# Patient Record
Sex: Female | Born: 1985 | Race: White | Hispanic: No | Marital: Married | Smoking: Never smoker
Health system: Southern US, Community
[De-identification: ages and names within clinical notes are randomized; demographics above are authoritative.]

## PROBLEM LIST (undated history)

## (undated) DIAGNOSIS — E079 Disorder of thyroid, unspecified: Secondary | ICD-10-CM

## (undated) HISTORY — PX: TUBAL LIGATION: SHX77

---
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
## 2005-09-22 ENCOUNTER — Emergency Department (HOSPITAL_COMMUNITY): Admission: EM | Admit: 2005-09-22 | Discharge: 2005-09-22 | Payer: Self-pay | Admitting: Emergency Medicine

## 2005-11-05 ENCOUNTER — Observation Stay (HOSPITAL_COMMUNITY): Admission: RE | Admit: 2005-11-05 | Discharge: 2005-11-05 | Payer: Self-pay | Admitting: Specialist

## 2015-07-11 ENCOUNTER — Emergency Department (HOSPITAL_COMMUNITY)
Admission: EM | Admit: 2015-07-11 | Discharge: 2015-07-11 | Disposition: A | Discharge status: Home / Self Care | Payer: BLUE CROSS/BLUE SHIELD | Attending: Emergency Medicine | Admitting: Emergency Medicine

## 2015-07-11 ENCOUNTER — Encounter (HOSPITAL_COMMUNITY): Payer: Self-pay | Admitting: Emergency Medicine

## 2015-07-11 DIAGNOSIS — Y9389 Activity, other specified: Secondary | ICD-10-CM | POA: Diagnosis not present

## 2015-07-11 DIAGNOSIS — Y999 Unspecified external cause status: Secondary | ICD-10-CM | POA: Diagnosis not present

## 2015-07-11 DIAGNOSIS — Y929 Unspecified place or not applicable: Secondary | ICD-10-CM | POA: Insufficient documentation

## 2015-07-11 DIAGNOSIS — W272XXA Contact with scissors, initial encounter: Secondary | ICD-10-CM | POA: Insufficient documentation

## 2015-07-11 DIAGNOSIS — S61213A Laceration without foreign body of left middle finger without damage to nail, initial encounter: Secondary | ICD-10-CM | POA: Insufficient documentation

## 2015-07-11 DIAGNOSIS — IMO0002 Reserved for concepts with insufficient information to code with codable children: Secondary | ICD-10-CM

## 2015-07-11 MED ORDER — LIDOCAINE HCL (PF) 2 % IJ SOLN
INTRAMUSCULAR | Status: AC
  Filled 2015-07-11: qty 10

## 2015-07-11 NOTE — ED Notes (Addendum)
Pt with lac to middle of left middle finger today from shears, bleeding controlled at this time.  last tetanus shot 5-10 years per pt

## 2015-07-11 NOTE — ED Notes (Signed)
Patient c/o laceration to tight middle finger. 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 No active bleeding noted at this. Band-aid applied.

## 2015-07-11 NOTE — ED Provider Notes (Signed)
CSN: [id number]     Arrival date & time 07/11/15  1048 History   First MD Initiated Contact with Patient 07/11/15 1056     Chief Complaint  Patient presents with  . Laceration     (Consider location/radiation/quality/duration/timing/severity/associated sxs/prior Treatment) The history is provided by the patient.   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  She has obtained hemostasis by applying direct pressure and a bandage.  She denies numbness distal to the injury site.  She has no significant past medical history.  She is up-to-date with her tetanus vaccine.     History reviewed. No pertinent past medical history. Past Surgical History  Procedure Laterality Date  . Tubal ligation     History reviewed. No pertinent family history. Social History  Substance Use Topics  . Smoking status: Never Smoker   . Smokeless tobacco: Never Used  . Alcohol Use: No   OB History    Gravida Para Term Preterm AB TAB SAB Ectopic Multiple Living   Review of Systems  Constitutional: Negative for fever and chills.  Respiratory: Negative for shortness of breath and wheezing.   Skin: Positive for wound.  Neurological: Negative for numbness.      Allergies  Review of patient's allergies indicates no known allergies.  Home Medications   Prior to Admission medications   Not on File   BP 124/73 mmHg  Pulse 86  Temp(Src) 98.3 F (36.8 C) (Oral)  Resp 18  Ht  (1.702 m)  Wt 52.164 kg  BMI 18.01 kg/m2  SpO2 10%  LMP 07/04/2015   Patient in no respiratory distress.  SPO2 of 10% is an obvious typo.  Nursing staff asked to repeat. Physical Exam  Constitutional: She is oriented to person, place, and time. She appears well-developed and well-nourished.  HENT:  Head: Normocephalic.  Cardiovascular: Normal rate.    Pulmonary/Chest: Effort normal.  Musculoskeletal: She exhibits no tenderness.  Neurological: She is alert and oriented to person, place, and time. No sensory deficit.  Skin: Laceration noted.  1 cm superficial laceration left dorsal long finger over PIP joint.  No deep structures visualized.  There is a flap laceration component to this wound.  Distal sensation is intact with less than 2 second cap refill.    ED Course  Procedures (including critical care time)  LACERATION REPAIR Performed by: Burgess Amor Authorized by: Burgess Amor Consent: Verbal consent obtained. Risks and benefits: risks, benefits and alternatives were discussed Consent given by: patient Patient identity confirmed: provided demographic data Prepped and Draped in normal sterile fashion Wound explored  Laceration Location: left long finger  Laceration Length: 1cm  No Foreign Bodies seen or palpated  Anesthesia: Digital block   Local anesthetic: lidocaine 2% without epinephrine  Anesthetic total: 2 ml  Irrigation method: syringe Amount of cleaning: standard  Skin closure: Ethilon 4-0   Number of sutures: 2   Technique: Simple interrupted   Patient tolerance: Patient tolerated the procedure well with no immediate complications.  Labs Review Labs Reviewed - No data to display  Imaging Review No results found. I have personally reviewed and evaluated these images and lab results as part of my medical decision-making.   EKG Interpretation None      MDM   Final diagnoses:  Laceration  Patients wound was dressed then finger splint applied to avoid flexion of the wound for the next several days.  Wound care instructions given.  Pt advised to have sutures removed in 10 days,  Return here sooner for any signs of infection including redness, swelling, worse pain or drainage of pus.       Burgess AmorJulie Ruther Ephraim, PA-C 07/11/15 1646  Glynn OctaveStephen Rancour, MD 07/11/15 [phone]

## 2016-11-11 ENCOUNTER — Encounter (HOSPITAL_COMMUNITY): Payer: Self-pay | Admitting: Emergency Medicine

## 2016-11-11 ENCOUNTER — Emergency Department (HOSPITAL_COMMUNITY): Payer: BLUE CROSS/BLUE SHIELD

## 2016-11-11 ENCOUNTER — Emergency Department (HOSPITAL_COMMUNITY)
Admission: EM | Admit: 2016-11-11 | Discharge: 2016-11-11 | Disposition: A | Discharge status: Home / Self Care | Payer: BLUE CROSS/BLUE SHIELD | Attending: Emergency Medicine | Admitting: Emergency Medicine

## 2016-11-11 DIAGNOSIS — N3090 Cystitis, unspecified without hematuria: Secondary | ICD-10-CM | POA: Diagnosis not present

## 2016-11-11 DIAGNOSIS — R946 Abnormal results of thyroid function studies: Secondary | ICD-10-CM | POA: Insufficient documentation

## 2016-11-11 DIAGNOSIS — R000 Tachycardia, unspecified: Secondary | ICD-10-CM | POA: Insufficient documentation

## 2016-11-11 DIAGNOSIS — R002 Palpitations: Secondary | ICD-10-CM | POA: Diagnosis not present

## 2016-11-11 DIAGNOSIS — E0590 Thyrotoxicosis, unspecified without thyrotoxic crisis or storm: Secondary | ICD-10-CM | POA: Insufficient documentation

## 2016-11-11 DIAGNOSIS — R0602 Shortness of breath: Secondary | ICD-10-CM | POA: Diagnosis not present

## 2016-11-11 DIAGNOSIS — R7989 Other specified abnormal findings of blood chemistry: Secondary | ICD-10-CM

## 2016-11-11 DIAGNOSIS — Z79899 Other long term (current) drug therapy: Secondary | ICD-10-CM | POA: Diagnosis not present

## 2016-11-11 LAB — URINALYSIS, ROUTINE W REFLEX MICROSCOPIC
BILIRUBIN URINE: NEGATIVE
Glucose, UA: NEGATIVE mg/dL
Hgb urine dipstick: NEGATIVE
Ketones, ur: 20 mg/dL — AB
Nitrite: NEGATIVE
PH: 6 (ref 5.0–8.0)
Protein, ur: NEGATIVE mg/dL
SPECIFIC GRAVITY, URINE: 1.016 (ref 1.005–1.030)

## 2016-11-11 LAB — COMPREHENSIVE METABOLIC PANEL
ALK PHOS: 39 U/L (ref 38–126)
ALT: 21 U/L (ref 14–54)
ANION GAP: 9 (ref 5–15)
AST: 23 U/L (ref 15–41)
Albumin: 4.1 g/dL (ref 3.5–5.0)
BUN: 17 mg/dL (ref 6–20)
CALCIUM: 9.6 mg/dL (ref 8.9–10.3)
CO2: 24 mmol/L (ref 22–32)
CREATININE: 0.58 mg/dL (ref 0.44–1.00)
Chloride: 104 mmol/L (ref 101–111)
Glucose, Bld: 97 mg/dL (ref 65–99)
Potassium: 3.6 mmol/L (ref 3.5–5.1)
SODIUM: 137 mmol/L (ref 135–145)
TOTAL PROTEIN: 7.3 g/dL (ref 6.5–8.1)
Total Bilirubin: 1.2 mg/dL (ref 0.3–1.2)

## 2016-11-11 LAB — CBC WITH DIFFERENTIAL/PLATELET
BASOS PCT: 0 %
Basophils Absolute: 0 10*3/uL (ref 0.0–0.1)
EOS ABS: 0 10*3/uL (ref 0.0–0.7)
Eosinophils Relative: 1 %
HEMATOCRIT: 36.7 % (ref 36.0–46.0)
HEMOGLOBIN: 12.6 g/dL (ref 12.0–15.0)
LYMPHS ABS: 1 10*3/uL (ref 0.7–4.0)
Lymphocytes Relative: 18 %
MCH: 30.3 pg (ref 26.0–34.0)
MCHC: 34.3 g/dL (ref 30.0–36.0)
MCV: 88.2 fL (ref 78.0–100.0)
MONO ABS: 0.6 10*3/uL (ref 0.1–1.0)
MONOS PCT: 11 %
NEUTROS ABS: 4.1 10*3/uL (ref 1.7–7.7)
NEUTROS PCT: 70 %
Platelets: 153 10*3/uL (ref 150–400)
RBC: 4.16 MIL/uL (ref 3.87–5.11)
RDW: 11.7 % (ref 11.5–15.5)
WBC: 5.8 10*3/uL (ref 4.0–10.5)

## 2016-11-11 LAB — RAPID URINE DRUG SCREEN, HOSP PERFORMED
AMPHETAMINES: NOT DETECTED
BENZODIAZEPINES: NOT DETECTED
Barbiturates: NOT DETECTED
COCAINE: NOT DETECTED
OPIATES: NOT DETECTED
TETRAHYDROCANNABINOL: NOT DETECTED

## 2016-11-11 LAB — PREGNANCY, URINE: PREG TEST UR: NEGATIVE

## 2016-11-11 LAB — T4, FREE: Free T4: 4.43 ng/dL — ABNORMAL HIGH (ref 0.61–1.12)

## 2016-11-11 LAB — TSH

## 2016-11-11 LAB — D-DIMER, QUANTITATIVE (NOT AT ARMC): D DIMER QUANT: 0.54 ug{FEU}/mL — AB (ref 0.00–0.50)

## 2016-11-11 LAB — TROPONIN I: Troponin I: <0.03 ng/mL (ref <0.03)

## 2016-11-11 MED ORDER — CEPHALEXIN 500 MG PO CAPS: 500.0000 mg | ORAL_CAPSULE | Freq: Four times a day (QID) | ORAL | 0 refills | Status: DC

## 2016-11-11 MED ORDER — ATENOLOL 25 MG PO TABS: 12.5000 mg | ORAL_TABLET | Freq: Every day | ORAL | 0 refills | Status: DC

## 2016-11-11 MED ORDER — IOPAMIDOL (ISOVUE-370) INJECTION 76%
100.0000 mL | Freq: Once | INTRAVENOUS | Status: AC | PRN
  Administered 2016-11-11: 100 mL via INTRAVENOUS

## 2016-11-11 MED ORDER — SODIUM CHLORIDE 0.9 % IV BOLUS (SEPSIS)
1000.0000 mL | Freq: Once | INTRAVENOUS | Status: AC
  Administered 2016-11-11: 1000 mL via INTRAVENOUS

## 2016-11-11 NOTE — ED Triage Notes (Signed)
Patient complaining of high heart rate x 1 week. Denies chest pain. States "it just feels like my hearts going to beat out of my chest."

## 2016-11-11 NOTE — ED Notes (Signed)
Patient transported to X-ray 

## 2016-11-11 NOTE — Discharge Instructions (Signed)
Take the prescription as directed.  Avoid avoid caffinated products, such as teas, colas, coffee, chocolate. Avoid over the counter cold medicines, herbal or "natural vitamin" products, and illicit drugs because they can contain stimulants. Call the Endocrinologist today to schedule a follow up appointment next week.  Return to the Emergency Department immediately sooner if worsening.

## 2016-11-11 NOTE — ED Provider Notes (Signed)
[facility]EMER[facility]ENCY DEPT Provider Note   CSN: [id number] Arrival date & time: 11/11/16  1026     History   Chief Complaint Chief Complaint  Patient presents with  . Tachycardia    HPI Lisa Copeland is a 31 y.o. female.  HPI Pt was seen at 1055. Per pt, c/o gradual onset and persistence of multiple intermittent episodes of "fast HR" for the past 1 week. States HR has ranged from 110's to 130's. Denies new medications, no N/V/D, no abd pain, no CP/SOB, no cough, no back pain, no fevers.     History reviewed. No pertinent past medical history.  There are no active problems to display for this patient.   Past Surgical History:  Procedure Laterality Date  . TUBAL LIGATION      OB History    Gravida Para Term Preterm AB Living   2 2 2          SAB TAB Ectopic Multiple Live Births                   Home Medications    Prior to Admission medications   Medication Sig Start Date End Date Taking? Authorizing Provider  ibuprofen (ADVIL,MOTRIN) 200 MG tablet Take 400 mg by mouth every 6 (six) hours as needed.   Yes [provider]    Family History History reviewed. No pertinent family history.  Social History Social History  Substance Use Topics  . Smoking status: Never Smoker  . Smokeless tobacco: Never Used  . Alcohol use No     Allergies   Patient has no known allergies.   Review of Systems Review of Systems ROS: Statement: All systems negative except as marked or noted in the HPI; Constitutional: Negative for fever and chills. ; ; Eyes: Negative for eye pain, redness and discharge. ; ; ENMT: Negative for ear pain, hoarseness, nasal congestion, sinus pressure and sore throat. ; ; Cardiovascular: +"fast HR." Negative for chest pain, diaphoresis, dyspnea and peripheral edema. ; ; Respiratory: Negative for cough, wheezing and stridor. ; ; Gastrointestinal: Negative for nausea, vomiting, diarrhea, abdominal pain, blood in stool, hematemesis, jaundice and  rectal bleeding. . ; ; Genitourinary: Negative for dysuria, flank pain and hematuria. ; ; Musculoskeletal: Negative for back pain and neck pain. Negative for swelling and trauma.; ; Skin: Negative for pruritus, rash, abrasions, blisters, bruising and skin lesion.; ; Neuro: Negative for headache, lightheadedness and neck stiffness. Negative for weakness, altered level of consciousness, altered mental status, extremity weakness, paresthesias, involuntary movement, seizure and syncope.       Physical Exam Updated Vital Signs BP 132/78 (BP Location: Left Arm)   Pulse (!) 125   Temp 98.3 F (36.8 C) (Oral)   Resp 18   Ht 5\' 7"  (1.702 m)   Wt 52.2 kg (115 lb)   LMP 10/19/2016   SpO2 100%   BMI 18.01 kg/m    Patient Vitals for the past 24 hrs:  BP Temp Temp src Pulse Resp SpO2 Height Weight  11/11/16 1230 115/63 - - (!) 111 13 100 % - -  11/11/16 1215 - - - (!) 114 16 100 % - -  11/11/16 1203 117/73 - - (!) 116 19 100 % - -  11/11/16 1039 - - - - - - 5\' 7"  (1.702 m) 52.2 kg (115 lb)  11/11/16 1038 132/78 98.3 F (36.8 C) Oral (!) 125 18 100 % - -      Physical Exam 1100: Physical examination:  Nursing  notes reviewed; Vital signs and O2 SAT reviewed;  Constitutional: Well developed, Well nourished, Well hydrated, In no acute distress; Head:  Normocephalic, atraumatic; Eyes: EOMI, PERRL, No scleral icterus; ENMT: Mouth and pharynx normal, Mucous membranes moist; Neck: Supple, Full range of motion, No lymphadenopathy; Cardiovascular: Tachycardic rate and rhythm, No gallop; Respiratory: Breath sounds clear & equal bilaterally, No wheezes.  Speaking full sentences with ease, Normal respiratory effort/excursion; Chest: Nontender, Movement normal; Abdomen: Soft, Nontender, Nondistended, Normal bowel sounds; Genitourinary: No CVA tenderness; Extremities: Pulses normal, No tenderness, No edema, No calf edema or asymmetry.; Neuro: AA&Ox3, Major CN grossly intact.  Speech clear. No gross focal motor  or sensory deficits in extremities.; Skin: Color normal, Warm, Dry.   ED Treatments / Results  Labs (all labs ordered are listed, but only abnormal results are displayed)   EKG  EKG Interpretation  Date/Time:  Friday November 11 2016 10:39:54 EDT Ventricular Rate:  117 PR Interval:    QRS Duration: 80 QT Interval:  297 QTC Calculation: 415 R Axis:   90 Text Interpretation:  Sinus tachycardia Borderline right axis deviation Nonspecific T abnormalities, anterior leads No old tracing to compare Confirmed by Samuel JesterMcManus, Deondrick Searls [phone]) on 11/11/2016 10:59:50 AM       Radiology   Procedures Procedures (including critical care time)  Medications Ordered in ED Medications  sodium chloride 0.9 % bolus 1,000 mL (not administered)     Initial Impression / Assessment and Plan / ED Course  I have reviewed the triage vital signs and the nursing notes.  Pertinent labs & imaging results that were available during my care of the patient were reviewed by me and considered in my medical decision making (see chart for details).  MDM Reviewed: previous chart, nursing note and vitals Reviewed previous: labs and ECG Interpretation: labs, ECG and x-ray   Results for orders placed or performed during the hospital encounter of 11/11/16  CBC with Differential  Result Value Ref Range   WBC 5.8 4.0 - 10.5 K/uL   RBC 4.16 3.87 - 5.11 MIL/uL   Hemoglobin 12.6 12.0 - 15.0 g/dL   HCT 19.136.7 47.836.0 - 29.546.0 %   MCV 88.2 78.0 - 100.0 fL   MCH 30.3 26.0 - 34.0 pg   MCHC 34.3 30.0 - 36.0 g/dL   RDW 62.111.7 30.811.5 - 65.715.5 %   Platelets 153 150 - 400 K/uL   Neutrophils Relative % 70 %   Neutro Abs 4.1 1.7 - 7.7 K/uL   Lymphocytes Relative 18 %   Lymphs Abs 1.0 0.7 - 4.0 K/uL   Monocytes Relative 11 %   Monocytes Absolute 0.6 0.1 - 1.0 K/uL   Eosinophils Relative 1 %   Eosinophils Absolute 0.0 0.0 - 0.7 K/uL   Basophils Relative 0 %   Basophils Absolute 0.0 0.0 - 0.1 K/uL  Troponin I  Result Value Ref  Range   Troponin I <0.03 <0.03 ng/mL  Comprehensive metabolic panel  Result Value Ref Range   Sodium 137 135 - 145 mmol/L   Potassium 3.6 3.5 - 5.1 mmol/L   Chloride 104 101 - 111 mmol/L   CO2 24 22 - 32 mmol/L   Glucose, Bld 97 65 - 99 mg/dL   BUN 17 6 - 20 mg/dL   Creatinine, Ser 8.460.58 0.44 - 1.00 mg/dL   Calcium 9.6 8.9 - 96.210.3 mg/dL   Total Protein 7.3 6.5 - 8.1 g/dL   Albumin 4.1 3.5 - 5.0 g/dL   AST 23 15 - 41 U/L  ALT 21 14 - 54 U/L   Alkaline Phosphatase 39 38 - 126 U/L   Total Bilirubin 1.2 0.3 - 1.2 mg/dL   GFR calc non Af Amer >60 >60 mL/min   GFR calc Af Amer >60 >60 mL/min   Anion gap 9 5 - 15  D-dimer, quantitative  Result Value Ref Range   D-Dimer, Quant 0.54 (H) 0.00 - 0.50 ug/mL-FEU  TSH  Result Value Ref Range   TSH <0.010 (L) 0.350 - 4.500 uIU/mL  Pregnancy, urine  Result Value Ref Range   Preg Test, Ur NEGATIVE NEGATIVE  Urinalysis, Routine w reflex microscopic  Result Value Ref Range   Color, Urine YELLOW YELLOW   APPearance CLEAR CLEAR   Specific Gravity, Urine 1.016 1.005 - 1.030   pH 6.0 5.0 - 8.0   Glucose, UA NEGATIVE NEGATIVE mg/dL   Hgb urine dipstick NEGATIVE NEGATIVE   Bilirubin Urine NEGATIVE NEGATIVE   Ketones, ur 20 (A) NEGATIVE mg/dL   Protein, ur NEGATIVE NEGATIVE mg/dL   Nitrite NEGATIVE NEGATIVE   Leukocytes, UA MODERATE (A) NEGATIVE   RBC / HPF 0-5 0 - 5 RBC/hpf   WBC, UA 6-30 0 - 5 WBC/hpf   Bacteria, UA RARE (A) NONE SEEN   Squamous Epithelial / LPF 0-5 (A) NONE SEEN   Mucous PRESENT   Urine rapid drug screen (hosp performed)  Result Value Ref Range   Opiates NONE DETECTED NONE DETECTED   Cocaine NONE DETECTED NONE DETECTED   Benzodiazepines NONE DETECTED NONE DETECTED   Amphetamines NONE DETECTED NONE DETECTED   Tetrahydrocannabinol NONE DETECTED NONE DETECTED   Barbiturates NONE DETECTED NONE DETECTED   Dg Chest 2 View Result Date: 11/11/2016 CLINICAL DATA:  Elevated heart rate. EXAM: CHEST  2 VIEW COMPARISON:  8/20  5/5 FINDINGS: The heart size and mediastinal contours are within normal limits. Bilateral nipple artifact identified. Both lungs are clear. The visualized skeletal structures are unremarkable. IMPRESSION: No active cardiopulmonary disease. Electronically Signed   By: Signa Kell M.D.   On: 11/11/2016 11:22   Ct Angio Chest Pe W/cm &/or Wo Cm Result Date: 11/11/2016 CLINICAL DATA:  Elevated heart rate and shortness breath for 1 week. EXAM: CT ANGIOGRAPHY CHEST WITH CONTRAST TECHNIQUE: Multidetector CT imaging of the chest was performed using the standard protocol during bolus administration of intravenous contrast. Multiplanar CT image reconstructions and MIPs were obtained to evaluate the vascular anatomy. CONTRAST:  100 cc Isovue 370. COMPARISON:  PA and lateral chest this same day. FINDINGS: Cardiovascular: No pulmonary embolus is identified. Heart size is normal. No pericardial effusion. No calcific atherosclerosis. Mediastinum/Nodes: No enlarged mediastinal, hilar, or axillary lymph nodes. Thyroid gland, trachea, and esophagus demonstrate no significant findings. Lungs/Pleura: Lungs are clear. No pleural effusion or pneumothorax. Upper Abdomen: Negative. Musculoskeletal: No fracture or focal lesion. Review of the MIP images confirms the above findings. IMPRESSION: Negative for pulmonary embolus.  Negative chest CT. Electronically Signed   By: Drusilla Kanner M.D.   On: 11/11/2016 13:22    1410:  TSH very low; T3 and free T4 pending. Likely hyperthyroidism. No s/s to suggest thyrotoxicosis at this time. Pt given IVF with slight improvement in HR; will rx low dose atenolol for symptomatic tachycardia. Pt will need f/u with Endo MD. Attempted to call Endo MD x2 hours without suceess. Pt states there is "a thyroid hx" in her family; so understands importance of f/u. Pt endorses mild dysuria; will tx for cystitis. Pt would like to go home now. Dx and  testing d/w pt.  Questions answered.  Verb understanding,  agreeable to d/c home with outpt f/u.      Final Clinical Impressions(s) / ED Diagnoses   Final diagnoses:  None    New Prescriptions New Prescriptions   No medications on file     Samuel Jester, DO 11/16/16 1515

## 2016-11-12 LAB — T3: T3 TOTAL: 436 ng/dL — AB (ref 71–180)

## 2016-11-16 ENCOUNTER — Encounter: Payer: Self-pay | Admitting: "Endocrinology

## 2016-11-16 ENCOUNTER — Ambulatory Visit (INDEPENDENT_AMBULATORY_CARE_PROVIDER_SITE_OTHER): Payer: BLUE CROSS/BLUE SHIELD | Admitting: "Endocrinology

## 2016-11-16 VITALS — BP 112/57 | HR 93 | Wt 110.0 lb

## 2016-11-16 DIAGNOSIS — E0590 Thyrotoxicosis, unspecified without thyrotoxic crisis or storm: Secondary | ICD-10-CM

## 2016-11-16 MED ORDER — ATENOLOL 25 MG PO TABS
12.5000 mg | ORAL_TABLET | Freq: Every day | ORAL | 1 refills | Status: DC
Start: 2016-11-16 — End: 2016-12-29

## 2016-11-16 NOTE — Progress Notes (Signed)
Subjective:    Patient ID: Lisa Copeland, female    DOB: 1985/04/09.  No past medical history on file. Past Surgical History:  Procedure Laterality Date  . TUBAL LIGATION     Social History   Social History  . Marital status: Married    Spouse name: N/A  . Number of children: N/A  . Years of education: N/A   Social History Main Topics  . Smoking status: Never Smoker  . Smokeless tobacco: Never Used  . Alcohol use No  . Drug use: No  . Sexual activity: Yes    Birth control/ protection: None   Other Topics Concern  . Not on file   Social History Narrative  . No narrative on file   Outpatient Encounter Prescriptions as of 11/16/2016  Medication Sig  . atenolol (TENORMIN) 25 MG tablet Take 0.5 tablets (12.5 mg total) by mouth daily.  . cephALEXin (KEFLEX) 500 MG capsule Take 1 capsule (500 mg total) by mouth 4 (four) times daily.  Marland Kitchen ibuprofen (ADVIL,MOTRIN) 200 MG tablet Take 400 mg by mouth every 6 (six) hours as needed.  . [DISCONTINUED] atenolol (TENORMIN) 25 MG tablet Take 0.5 tablets (12.5 mg total) by mouth daily.   No facility-administered encounter medications on file as of 11/16/2016.    ALLERGIES: No Known Allergies VACCINATION STATUS:  There is no immunization history on file for this patient.  HPI  The patient presents today with a medical history as above, and is being seen in consultation for hyperthyroidism requested by Dr.McMannus.  The patient has been dealing with symptoms of  Palpitations, tremors, and heat intolerance and weight loss of 5 pounds for 4-5 weeks. These symptoms are progressively worsening and troubling to patient, went to emergency room where she was found to have tachycardia and initiated on low-dose atenolol which has helped  symptomatically.   The patient has family history of thyroid dysfunction  in her father reportedly was thyroid cancer and one of her grandparents who has hypothyroidism,  but the patient denies personal  history of goiter. Patient is not on any anti-thyroid medications nor on any thyroid hormone supplements. Patient  is willing to proceed with appropriate work up and therapy for thyrotoxicosis.  Constitutional: +weight gain/loss, + fatigue, +subjective hyperthermia, no subjective hypothermia Eyes: no blurry vision, no xerophthalmia ENT: no sore throat, no nodules palpated in throat, no dysphagia/odynophagia, no hoarseness Cardiovascular: no Chest Pain, no Shortness of Breath, + palpitations, no leg swelling Respiratory: no cough, no SOB Gastrointestinal: no Nausea/Vomiting/Diarhhea Musculoskeletal: no muscle/joint aches Skin: no rashes Neurological: + tremors, no numbness, no tingling, no dizziness Psychiatric: no depression, no anxiety   Objective:    BP (!) 112/57   Pulse 93   Wt 110 lb (49.9 kg)   LMP 10/19/2016   BMI 17.23 kg/m   Wt Readings from Last 3 Encounters:  11/16/16 110 lb (49.9 kg)  11/11/16 115 lb (52.2 kg)  07/11/15 115 lb (52.2 kg)     Constitutional: + Light build for height , not in acute distress, normal state of mind Eyes: PERRLA, EOMI, no exophthalmos ENT: moist mucous membranes, + thyromegaly, no cervical lymphadenopathy Cardiovascular: normal precordial activity, Regular Rate and Rhythm ( on beta blocker), no Murmur/Rubs/Gallops Respiratory:  adequate breathing efforts, no gross chest deformity, Clear to auscultation bilaterally Gastrointestinal: abdomen soft, Non -tender, No distension, Bowel Sounds present Musculoskeletal: no gross deformities, strength intact in all four extremities Skin: moist, warm, no rashes Neurological: no tremor with outstretched hands,  Deep tendon reflexes normal in all four extremities.   Results for orders placed or performed during the hospital encounter of 11/11/16  CBC with Differential  Result Value Ref Range   WBC 5.8 4.0 - 10.5 K/uL   RBC 4.16 3.87 - 5.11 MIL/uL   Hemoglobin 12.6 12.0 - 15.0 g/dL   HCT 16.1 09.6 -  04.5 %   MCV 88.2 78.0 - 100.0 fL   MCH 30.3 26.0 - 34.0 pg   MCHC 34.3 30.0 - 36.0 g/dL   RDW 40.9 81.1 - 91.4 %   Platelets 153 150 - 400 K/uL   Neutrophils Relative % 70 %   Neutro Abs 4.1 1.7 - 7.7 K/uL   Lymphocytes Relative 18 %   Lymphs Abs 1.0 0.7 - 4.0 K/uL   Monocytes Relative 11 %   Monocytes Absolute 0.6 0.1 - 1.0 K/uL   Eosinophils Relative 1 %   Eosinophils Absolute 0.0 0.0 - 0.7 K/uL   Basophils Relative 0 %   Basophils Absolute 0.0 0.0 - 0.1 K/uL  Troponin I  Result Value Ref Range   Troponin I <0.03 <0.03 ng/mL  Comprehensive metabolic panel  Result Value Ref Range   Sodium 137 135 - 145 mmol/L   Potassium 3.6 3.5 - 5.1 mmol/L   Chloride 104 101 - 111 mmol/L   CO2 24 22 - 32 mmol/L   Glucose, Bld 97 65 - 99 mg/dL   BUN 17 6 - 20 mg/dL   Creatinine, Ser 7.82 0.44 - 1.00 mg/dL   Calcium 9.6 8.9 - 95.6 mg/dL   Total Protein 7.3 6.5 - 8.1 g/dL   Albumin 4.1 3.5 - 5.0 g/dL   AST 23 15 - 41 U/L   ALT 21 14 - 54 U/L   Alkaline Phosphatase 39 38 - 126 U/L   Total Bilirubin 1.2 0.3 - 1.2 mg/dL   GFR calc non Af Amer >60 >60 mL/min   GFR calc Af Amer >60 >60 mL/min   Anion gap 9 5 - 15  D-dimer, quantitative  Result Value Ref Range   D-Dimer, Quant 0.54 (H) 0.00 - 0.50 ug/mL-FEU  TSH  Result Value Ref Range   TSH <0.010 (L) 0.350 - 4.500 uIU/mL  Pregnancy, urine  Result Value Ref Range   Preg Test, Ur NEGATIVE NEGATIVE  Urinalysis, Routine w reflex microscopic  Result Value Ref Range   Color, Urine YELLOW YELLOW   APPearance CLEAR CLEAR   Specific Gravity, Urine 1.016 1.005 - 1.030   pH 6.0 5.0 - 8.0   Glucose, UA NEGATIVE NEGATIVE mg/dL   Hgb urine dipstick NEGATIVE NEGATIVE   Bilirubin Urine NEGATIVE NEGATIVE   Ketones, ur 20 (A) NEGATIVE mg/dL   Protein, ur NEGATIVE NEGATIVE mg/dL   Nitrite NEGATIVE NEGATIVE   Leukocytes, UA MODERATE (A) NEGATIVE   RBC / HPF 0-5 0 - 5 RBC/hpf   WBC, UA 6-30 0 - 5 WBC/hpf   Bacteria, UA RARE (A) NONE SEEN    Squamous Epithelial / LPF 0-5 (A) NONE SEEN   Mucous PRESENT   Urine rapid drug screen (hosp performed)  Result Value Ref Range   Opiates NONE DETECTED NONE DETECTED   Cocaine NONE DETECTED NONE DETECTED   Benzodiazepines NONE DETECTED NONE DETECTED   Amphetamines NONE DETECTED NONE DETECTED   Tetrahydrocannabinol NONE DETECTED NONE DETECTED   Barbiturates NONE DETECTED NONE DETECTED  T3  Result Value Ref Range   T3, Total 436 (H) 71 - 180 ng/dL  T4, free  Result Value Ref Range   Free T4 4.43 (H) 0.61 - 1.12 ng/dL     Assessment & Plan:   1. Hyperthyroidism  Patient's history and most recent labs are reviewed. Findings are consistent with thyrotoxicosis likely from hyperthyroidism. The potential risks of untreated thyrotoxicosis and the need for definitive therapy have been discussed in detail with the patient, and the patient agrees to proceed with plan.   I like to obtain confirmatory thyroid uptake and scan  which will be scheduled to be done as soon as possible. She is status post tubal ligation .   Therapy may involve RAI ablation of the thyroid, with subsequent need for lifelong thyroid hormone replacement. Pt is made aware of this fact and willing to proceed.  The patient will return1 week  for treatment decision. I  advised her to continue atenolol 12.5 mg by mouth daily for symptomatic relief.  - Greater than 50% of 45 minutes  visit was spent in counseling/coordination of care regarding  Hyperthyroidism. She is  advised patient to maintain close follow up with her PMD  for primary care needs.  Follow up plan: Return in about 1 week (around 11/23/2016) for follow up with thyroid uptake and scan.  Marquis LunchGebre Nida, MD Phone: [phone]  Fax: [phone]   11/16/2016, 5:02 PM

## 2016-11-21 ENCOUNTER — Telehealth: Payer: Self-pay | Admitting: "Endocrinology

## 2016-11-21 ENCOUNTER — Encounter (HOSPITAL_COMMUNITY)
Admission: RE | Admit: 2016-11-21 | Discharge: 2016-11-21 | Disposition: A | Discharge status: Home / Self Care | Payer: BLUE CROSS/BLUE SHIELD | Source: Ambulatory Visit | Attending: "Endocrinology | Admitting: "Endocrinology

## 2016-11-21 ENCOUNTER — Other Ambulatory Visit: Payer: Self-pay | Admitting: "Endocrinology

## 2016-11-21 DIAGNOSIS — E0590 Thyrotoxicosis, unspecified without thyrotoxic crisis or storm: Secondary | ICD-10-CM | POA: Insufficient documentation

## 2016-11-21 MED ORDER — METHIMAZOLE 5 MG PO TABS: 5.0000 mg | ORAL_TABLET | Freq: Every day | ORAL | 0 refills | Status: DC

## 2016-11-21 MED ORDER — METHIMAZOLE 5 MG PO TABS: 5.0000 mg | ORAL_TABLET | Freq: Three times a day (TID) | ORAL | 0 refills | Status: DC

## 2016-11-21 NOTE — Telephone Encounter (Signed)
Lisa Copeland is calling stating that she has had to reschedule the Thyroid Uptake scan for 5 more weeks out due to having contrast when she was in the ER. So now her scan is not until 12/26/16 and she is asking what can she do in the meantime, please advise?

## 2016-11-21 NOTE — Telephone Encounter (Signed)
We need to treat her with methimazole until 10 days from her scan, I will send in a prescription for 30 days of methimazole 5 mg by mouth daily.

## 2016-11-21 NOTE — Telephone Encounter (Signed)
Pt.notified

## 2016-11-22 ENCOUNTER — Encounter (HOSPITAL_COMMUNITY): Payer: BLUE CROSS/BLUE SHIELD

## 2016-11-24 ENCOUNTER — Ambulatory Visit: Payer: BLUE CROSS/BLUE SHIELD | Admitting: "Endocrinology

## 2016-11-28 ENCOUNTER — Ambulatory Visit: Payer: Self-pay | Admitting: "Endocrinology

## 2016-11-30 ENCOUNTER — Telehealth: Payer: Self-pay

## 2016-11-30 NOTE — Telephone Encounter (Signed)
Pt.notified

## 2016-11-30 NOTE — Telephone Encounter (Signed)
She can increase the methimazole to 5 mg twice a day, and if she is having palpitations, she should take atenolol 25 mg daily instead of 12.5 mg. If she is already taking 25mg , can  increase to 50 mg once daily. - She should note that methimazole is slow to act on the thyroid and not expected to reverse symptoms until at least she takes it for 3 weeks.

## 2016-11-30 NOTE — Telephone Encounter (Signed)
Pt states that she is having Fluid around her ankles which started yesterday. Also states hse feels no better on the methimazole. She cannot have the uptake/scan until 12-26-16 due to contrast

## 2016-12-26 ENCOUNTER — Encounter (HOSPITAL_COMMUNITY)
Admission: RE | Admit: 2016-12-26 | Discharge: 2016-12-26 | Disposition: A | Discharge status: Home / Self Care | Payer: BLUE CROSS/BLUE SHIELD | Source: Ambulatory Visit | Attending: "Endocrinology | Admitting: "Endocrinology

## 2016-12-26 ENCOUNTER — Encounter (HOSPITAL_COMMUNITY): Payer: Self-pay

## 2016-12-26 DIAGNOSIS — E0590 Thyrotoxicosis, unspecified without thyrotoxic crisis or storm: Secondary | ICD-10-CM | POA: Insufficient documentation

## 2016-12-26 MED ORDER — SODIUM IODIDE I-123 7.4 MBQ CAPS
400.0000 | ORAL_CAPSULE | Freq: Once | ORAL | Status: AC
Start: 2016-12-26 — End: 2016-12-26
  Administered 2016-12-26: 382 via ORAL

## 2016-12-27 ENCOUNTER — Encounter (HOSPITAL_COMMUNITY)
Admission: RE | Admit: 2016-12-27 | Discharge: 2016-12-27 | Disposition: A | Discharge status: Home / Self Care | Payer: BLUE CROSS/BLUE SHIELD | Source: Ambulatory Visit | Attending: "Endocrinology | Admitting: "Endocrinology

## 2016-12-27 DIAGNOSIS — E0590 Thyrotoxicosis, unspecified without thyrotoxic crisis or storm: Secondary | ICD-10-CM | POA: Diagnosis present

## 2016-12-29 ENCOUNTER — Ambulatory Visit (INDEPENDENT_AMBULATORY_CARE_PROVIDER_SITE_OTHER): Payer: BLUE CROSS/BLUE SHIELD | Admitting: "Endocrinology

## 2016-12-29 ENCOUNTER — Encounter: Payer: Self-pay | Admitting: "Endocrinology

## 2016-12-29 VITALS — BP 115/69 | HR 125 | Wt 109.0 lb

## 2016-12-29 DIAGNOSIS — E0500 Thyrotoxicosis with diffuse goiter without thyrotoxic crisis or storm: Secondary | ICD-10-CM | POA: Diagnosis not present

## 2016-12-29 DIAGNOSIS — E0590 Thyrotoxicosis, unspecified without thyrotoxic crisis or storm: Secondary | ICD-10-CM

## 2016-12-29 MED ORDER — PROPRANOLOL HCL 20 MG PO TABS: 20.0000 mg | ORAL_TABLET | Freq: Two times a day (BID) | ORAL | 2 refills | Status: DC

## 2016-12-29 NOTE — Progress Notes (Signed)
Subjective:    Patient ID: Lisa Copeland, female    DOB: 1986/01/06.  History reviewed. No pertinent past medical history. Past Surgical History:  Procedure Laterality Date  . TUBAL LIGATION     Social History   Social History  . Marital status: Married    Spouse name: N/A  . Number of children: N/A  . Years of education: N/A   Social History Main Topics  . Smoking status: Never Smoker  . Smokeless tobacco: Never Used  . Alcohol use No  . Drug use: No  . Sexual activity: Yes    Birth control/ protection: None   Other Topics Concern  . None   Social History Narrative  . None   Outpatient Encounter Prescriptions as of 12/29/2016  Medication Sig  . ibuprofen (ADVIL,MOTRIN) 200 MG tablet Take 400 mg by mouth every 6 (six) hours as needed.  . propranolol (INDERAL) 20 MG tablet Take 1 tablet (20 mg total) by mouth 2 (two) times daily.  . [DISCONTINUED] atenolol (TENORMIN) 25 MG tablet Take 0.5 tablets (12.5 mg total) by mouth daily.  . [DISCONTINUED] cephALEXin (KEFLEX) 500 MG capsule Take 1 capsule (500 mg total) by mouth 4 (four) times daily.  . [DISCONTINUED] methimazole (TAPAZOLE) 5 MG tablet Take 1 tablet (5 mg total) by mouth daily. (Patient not taking: Reported on 12/29/2016)   No facility-administered encounter medications on file as of 12/29/2016.    ALLERGIES: No Known Allergies VACCINATION STATUS:  There is no immunization history on file for this patient.  HPI  The patient presents today with The results of her thyroid uptake and scan. - She was diagnosed with hyperthyroidism in August 2018. - Her uptake and scan was delayed due to her recent exposure to contrast media with iodine. - She was briefly treated with methimazole and beta blocker.    The patient has been dealing with symptoms of  Palpitations, tremors, and heat intolerance and weight loss of 5 pounds  over 8 weeks.   The patient has family history of thyroid dysfunction  in her father  reportedly was thyroid cancer and one of her grandparents who has hypothyroidism,  but the patient denies personal history of goiter.     Constitutional: +weight gain/loss, + fatigue, +subjective hyperthermia, no subjective hypothermia Eyes: no blurry vision, no xerophthalmia ENT: no sore throat, no nodules palpated in throat, no dysphagia/odynophagia, no hoarseness Cardiovascular: no Chest Pain, no Shortness of Breath, + palpitations, no leg swelling Respiratory: no cough, no SOB Gastrointestinal: no Nausea/Vomiting/Diarhhea Musculoskeletal: no muscle/joint aches Skin: no rashes Neurological: + tremors, no numbness, no tingling, no dizziness Psychiatric: no depression, no anxiety   Objective:    BP 115/69   Pulse (!) 125   Wt 109 lb (49.4 kg)   LMP 12/22/2016   BMI 17.07 kg/m   Wt Readings from Last 3 Encounters:  12/29/16 109 lb (49.4 kg)  11/16/16 110 lb (49.9 kg)  11/11/16 115 lb (52.2 kg)     Constitutional: + Light build for height , not in acute distress, normal state of mind Eyes: PERRLA, EOMI, no exophthalmos ENT: moist mucous membranes, + thyromegaly, no cervical lymphadenopathy Cardiovascular: normal precordial activity, + tachycardic at 125 ( on small dose of beta blocker), no Murmur/Rubs/Gallops Respiratory:  adequate breathing efforts, no gross chest deformity, Clear to auscultation bilaterally Gastrointestinal: abdomen soft, Non -tender, No distension, Bowel Sounds present Musculoskeletal: no gross deformities, strength intact in all four extremities Skin: moist, warm, no rashes Neurological: + tremor  with outstretched hands, + brisk Deep tendon reflexes normal in all four extremities.   Results for orders placed or performed during the hospital encounter of 11/11/16  CBC with Differential  Result Value Ref Range   WBC 5.8 4.0 - 10.5 K/uL   RBC 4.16 3.87 - 5.11 MIL/uL   Hemoglobin 12.6 12.0 - 15.0 g/dL   HCT 16.1 09.6 - 04.5 %   MCV 88.2 78.0 - 100.0 fL    MCH 30.3 26.0 - 34.0 pg   MCHC 34.3 30.0 - 36.0 g/dL   RDW 40.9 81.1 - 91.4 %   Platelets 153 150 - 400 K/uL   Neutrophils Relative % 70 %   Neutro Abs 4.1 1.7 - 7.7 K/uL   Lymphocytes Relative 18 %   Lymphs Abs 1.0 0.7 - 4.0 K/uL   Monocytes Relative 11 %   Monocytes Absolute 0.6 0.1 - 1.0 K/uL   Eosinophils Relative 1 %   Eosinophils Absolute 0.0 0.0 - 0.7 K/uL   Basophils Relative 0 %   Basophils Absolute 0.0 0.0 - 0.1 K/uL  Troponin I  Result Value Ref Range   Troponin I <0.03 <0.03 ng/mL  Comprehensive metabolic panel  Result Value Ref Range   Sodium 137 135 - 145 mmol/L   Potassium 3.6 3.5 - 5.1 mmol/L   Chloride 104 101 - 111 mmol/L   CO2 24 22 - 32 mmol/L   Glucose, Bld 97 65 - 99 mg/dL   BUN 17 6 - 20 mg/dL   Creatinine, Ser 7.82 0.44 - 1.00 mg/dL   Calcium 9.6 8.9 - 95.6 mg/dL   Total Protein 7.3 6.5 - 8.1 g/dL   Albumin 4.1 3.5 - 5.0 g/dL   AST 23 15 - 41 U/L   ALT 21 14 - 54 U/L   Alkaline Phosphatase 39 38 - 126 U/L   Total Bilirubin 1.2 0.3 - 1.2 mg/dL   GFR calc non Af Amer >60 >60 mL/min   GFR calc Af Amer >60 >60 mL/min   Anion gap 9 5 - 15  D-dimer, quantitative  Result Value Ref Range   D-Dimer, Quant 0.54 (H) 0.00 - 0.50 ug/mL-FEU  TSH  Result Value Ref Range   TSH <0.010 (L) 0.350 - 4.500 uIU/mL  Pregnancy, urine  Result Value Ref Range   Preg Test, Ur NEGATIVE NEGATIVE  Urinalysis, Routine w reflex microscopic  Result Value Ref Range   Color, Urine YELLOW YELLOW   APPearance CLEAR CLEAR   Specific Gravity, Urine 1.016 1.005 - 1.030   pH 6.0 5.0 - 8.0   Glucose, UA NEGATIVE NEGATIVE mg/dL   Hgb urine dipstick NEGATIVE NEGATIVE   Bilirubin Urine NEGATIVE NEGATIVE   Ketones, ur 20 (A) NEGATIVE mg/dL   Protein, ur NEGATIVE NEGATIVE mg/dL   Nitrite NEGATIVE NEGATIVE   Leukocytes, UA MODERATE (A) NEGATIVE   RBC / HPF 0-5 0 - 5 RBC/hpf   WBC, UA 6-30 0 - 5 WBC/hpf   Bacteria, UA RARE (A) NONE SEEN   Squamous Epithelial / LPF 0-5 (A) NONE  SEEN   Mucus PRESENT   Urine rapid drug screen (hosp performed)  Result Value Ref Range   Opiates NONE DETECTED NONE DETECTED   Cocaine NONE DETECTED NONE DETECTED   Benzodiazepines NONE DETECTED NONE DETECTED   Amphetamines NONE DETECTED NONE DETECTED   Tetrahydrocannabinol NONE DETECTED NONE DETECTED   Barbiturates NONE DETECTED NONE DETECTED  T3  Result Value Ref Range   T3, Total 436 (H) 71 - 180  ng/dL  T4, free  Result Value Ref Range   Free T4 4.43 (H) 0.61 - 1.12 ng/dL     Assessment & Plan:   1. Hyperthyroidism 2. Graves' disease  Patient's history and most recent labs / uptake and scan are reviewed. Findings are consistent with thyrotoxicosis likely from hyperthyroidism / Graves' disease. The potential risks of untreated thyrotoxicosis and the need for definitive therapy have been discussed in detail with the patient, and the patient agrees to proceed with plan.  - Her uptake was uniform at 82% in 24 hours.   - Based choice of therapy for her will be  RAI ablation of the thyroid, with subsequent need for lifelong thyroid hormone replacement. Patient is made aware of this outcome  and willing to proceed. - This treatment would be scheduled to be administered as soon as possible.  - Her beta blocker will be switched to propranolol 20 mg by mouth twice a day given her significant tachycardia. She is advised to discontinue methimazole.  - Radioactive iodine and precaution with small children in the house is discussed with the patient.   Follow up plan: Return in about 8 weeks (around 02/23/2017) for follow up with pre-visit labs, follow up with labs after I131 therapy.  Marquis Lunch, MD Phone: [phone]  Fax: [phone]  This note was partially dictated with voice recognition software. Similar sounding words can be transcribed inadequately or may not  be corrected upon review.  12/29/2016, 1:30 PM

## 2017-01-06 ENCOUNTER — Encounter (HOSPITAL_COMMUNITY)
Admission: RE | Admit: 2017-01-06 | Discharge: 2017-01-06 | Disposition: A | Discharge status: Home / Self Care | Payer: BLUE CROSS/BLUE SHIELD | Source: Ambulatory Visit | Attending: "Endocrinology | Admitting: "Endocrinology

## 2017-01-06 DIAGNOSIS — E0590 Thyrotoxicosis, unspecified without thyrotoxic crisis or storm: Secondary | ICD-10-CM | POA: Diagnosis not present

## 2017-01-06 MED ORDER — SODIUM IODIDE I 131 CAPSULE
12.0000 | Freq: Once | INTRAVENOUS | Status: AC | PRN
  Administered 2017-01-06: 12.2 via ORAL

## 2017-01-10 ENCOUNTER — Other Ambulatory Visit: Payer: Self-pay | Admitting: "Endocrinology

## 2017-01-10 ENCOUNTER — Telehealth: Payer: Self-pay | Admitting: "Endocrinology

## 2017-01-10 MED ORDER — PROPRANOLOL HCL 40 MG PO TABS: 40.0000 mg | ORAL_TABLET | Freq: Three times a day (TID) | ORAL | 1 refills | Status: DC

## 2017-01-10 MED ORDER — PREDNISONE 20 MG PO TABS: 20.0000 mg | ORAL_TABLET | Freq: Every day | ORAL | 0 refills | Status: DC

## 2017-01-10 NOTE — Telephone Encounter (Signed)
Pt.notified

## 2017-01-10 NOTE — Telephone Encounter (Signed)
Lisa Copeland is stating that she is now experiencing rapid heart beats off & on she feels like her heart rate is staying in the 120's please advise?

## 2017-01-10 NOTE — Telephone Encounter (Signed)
She can increase her Propranolol to 40 mg ( 2 pills) 3 times a day,and I will send in a short course of Prednisone  20 mg po q AM  x 7 days to her pharmacy. She has to let us know her response.

## 2017-02-27 ENCOUNTER — Other Ambulatory Visit: Payer: Self-pay | Admitting: "Endocrinology

## 2017-02-28 LAB — T4, FREE: FREE T4: 1.49 ng/dL (ref 0.82–1.77)

## 2017-02-28 LAB — TSH

## 2017-03-03 ENCOUNTER — Ambulatory Visit (INDEPENDENT_AMBULATORY_CARE_PROVIDER_SITE_OTHER): Payer: BLUE CROSS/BLUE SHIELD | Admitting: "Endocrinology

## 2017-03-03 ENCOUNTER — Encounter: Payer: Self-pay | Admitting: "Endocrinology

## 2017-03-03 VITALS — BP 119/71 | HR 78 | Ht 67.0 in | Wt 114.4 lb

## 2017-03-03 DIAGNOSIS — E0500 Thyrotoxicosis with diffuse goiter without thyrotoxic crisis or storm: Secondary | ICD-10-CM

## 2017-03-03 DIAGNOSIS — E890 Postprocedural hypothyroidism: Secondary | ICD-10-CM | POA: Diagnosis not present

## 2017-03-03 MED ORDER — LEVOTHYROXINE SODIUM 25 MCG PO TABS: 88.0000 ug | ORAL_TABLET | Freq: Every day | ORAL | 1 refills | Status: DC

## 2017-03-03 NOTE — Progress Notes (Signed)
Subjective:    Patient ID: Lisa Copeland, female    DOB: 05/15/1985.  History reviewed. No pertinent past medical history. Past Surgical History:  Procedure Laterality Date  . TUBAL LIGATION     Social History   Socioeconomic History  . Marital status: Married    Spouse name: None  . Number of children: None  . Years of education: None  . Highest education level: None  Social Needs  . Financial resource strain: None  . Food insecurity - worry: None  . Food insecurity - inability: None  . Transportation needs - medical: None  . Transportation needs - non-medical: None  Occupational History  . None  Tobacco Use  . Smoking status: Never Smoker  . Smokeless tobacco: Never Used  Substance and Sexual Activity  . Alcohol use: No  . Drug use: No  . Sexual activity: Yes    Birth control/protection: None  Other Topics Concern  . None  Social History Narrative  . None   Outpatient Encounter Medications as of 03/03/2017  Medication Sig  . ibuprofen (ADVIL,MOTRIN) 200 MG tablet Take 400 mg by mouth every 6 (six) hours as needed.  . [DISCONTINUED] propranolol (INDERAL) 40 MG tablet Take 1 tablet (40 mg total) by mouth 3 (three) times daily.  Marland Kitchen. levothyroxine (SYNTHROID, LEVOTHROID) 25 MCG tablet Take 3.5 tablets (88 mcg total) by mouth daily before breakfast.  . [DISCONTINUED] predniSONE (DELTASONE) 20 MG tablet Take 1 tablet (20 mg total) by mouth daily with breakfast. (Patient not taking: Reported on 03/03/2017)   No facility-administered encounter medications on file as of 03/03/2017.    ALLERGIES: No Known Allergies VACCINATION STATUS:  There is no immunization history on file for this patient.  HPI Lisa Copeland returns status post I-131 therapy for hyperthyroidism from Graves' disease. - Therapy was administered on 01/06/2017. - She reports improvement in her symptoms which have largely subsided , gained 5 pounds of weight  The patient has family history of thyroid  dysfunction  in her father reportedly was thyroid cancer and one of her grandparents who has hypothyroidism,  but the patient denies personal history of goiter.     Constitutional: +weight gain, + fatigue, -subjective hyperthermia, no subjective hypothermia Eyes: no blurry vision, no xerophthalmia ENT: no sore throat, no nodules palpated in throat, no dysphagia/odynophagia, no hoarseness Cardiovascular: no Chest Pain, no Shortness of Breath, + palpitations, no leg swelling Respiratory: no cough, no SOB Gastrointestinal: no Nausea/Vomiting/Diarhhea Musculoskeletal: no muscle/joint aches Skin: no rashes Neurological: - tremors, no numbness, no tingling, no dizziness Psychiatric: no depression, no anxiety   Objective:    BP 119/71   Pulse 78   Ht 5\' 7"  (1.702 m)   Wt 114 lb 6.4 oz (51.9 kg)   BMI 17.92 kg/m   Wt Readings from Last 3 Encounters:  03/03/17 114 lb 6.4 oz (51.9 kg)  12/29/16 109 lb (49.4 kg)  11/16/16 110 lb (49.9 kg)     Constitutional: + Light build for height , not in acute distress, normal state of mind Eyes: PERRLA, EOMI, no exophthalmos ENT: moist mucous membranes, +  thyromegaly, no cervical lymphadenopathy Cardiovascular: normal precordial activity, + tachycardic at 125 ( on small dose of beta blocker), no Murmur/Rubs/Gallops Respiratory:  adequate breathing efforts, no gross chest deformity, Clear to auscultation bilaterally Gastrointestinal: abdomen soft, Non -tender, No distension, Bowel Sounds present Musculoskeletal: no gross deformities, strength intact in all four extremities Skin: moist, warm, no rashes Neurological: + tremor with outstretched hands, +  brisk Deep tendon reflexes normal in all four extremities.   Results for orders placed or performed in visit on 02/27/17  T4, free  Result Value Ref Range   Free T4 1.49 0.82 - 1.77 ng/dL  TSH  Result Value Ref Range   TSH <0.006 (L) 0.450 - 4.500 uIU/mL     Assessment & Plan:   1.  Hyperthyroidism status post RAI therapy 2. Graves' disease - Patient has clinical evidence of treatment effect including weight gain and fatigue. - She'll benefit from early initiation of thyroid hormone replacement. I discussed and initiated levothyroxine 25 g by mouth every morning to advance as tolerated.  - We discussed about correct intake of levothyroxine, at fasting, with water, separated by at least 30 minutes from breakfast, and separated by more than 4 hours from calcium, iron, multivitamins, acid reflux medications (PPIs). -Patient is made aware of the fact that thyroid hormone replacement is needed for life, dose to be adjusted by periodic monitoring of thyroid function tests.  Follow up plan: Return in about 8 weeks (around 04/28/2017) for follow up with pre-visit labs.  Lisa LunchGebre Lisa Wainwright, MD Phone: [phone]  Fax: [phone]  This note was partially dictated with voice recognition software. Similar sounding words can be transcribed inadequately or may not  be corrected upon review.  03/03/2017, 12:07 PM

## 2017-03-07 ENCOUNTER — Other Ambulatory Visit: Payer: Self-pay

## 2017-03-07 MED ORDER — LEVOTHYROXINE SODIUM 25 MCG PO TABS: 25.0000 ug | ORAL_TABLET | Freq: Every day | ORAL | 2 refills | Status: DC

## 2017-04-10 ENCOUNTER — Other Ambulatory Visit: Payer: Self-pay | Admitting: "Endocrinology

## 2017-04-21 ENCOUNTER — Other Ambulatory Visit: Payer: Self-pay | Admitting: "Endocrinology

## 2017-04-22 LAB — TSH: TSH: 45.43 u[IU]/mL — AB (ref 0.450–4.500)

## 2017-04-22 LAB — T4, FREE: FREE T4: 0.57 ng/dL — AB (ref 0.82–1.77)

## 2017-04-28 ENCOUNTER — Encounter: Payer: Self-pay | Admitting: "Endocrinology

## 2017-04-28 ENCOUNTER — Ambulatory Visit: Payer: BLUE CROSS/BLUE SHIELD | Admitting: "Endocrinology

## 2017-04-28 VITALS — BP 113/74 | HR 76 | Ht 67.0 in | Wt 116.0 lb

## 2017-04-28 DIAGNOSIS — E890 Postprocedural hypothyroidism: Secondary | ICD-10-CM

## 2017-04-28 MED ORDER — LEVOTHYROXINE SODIUM 75 MCG PO TABS: ORAL_TABLET | ORAL | 2 refills | Status: DC

## 2017-04-28 NOTE — Progress Notes (Signed)
Subjective:    Patient ID: Lisa Copeland, female    DOB: 1985/08/07.  History reviewed. No pertinent past medical history. Past Surgical History:  Procedure Laterality Date  . TUBAL LIGATION     Social History   Socioeconomic History  . Marital status: Married    Spouse name: None  . Number of children: None  . Years of education: None  . Highest education level: None  Social Needs  . Financial resource strain: None  . Food insecurity - worry: None  . Food insecurity - inability: None  . Transportation needs - medical: None  . Transportation needs - non-medical: None  Occupational History  . None  Tobacco Use  . Smoking status: Never Smoker  . Smokeless tobacco: Never Used  Substance and Sexual Activity  . Alcohol use: No  . Drug use: No  . Sexual activity: Yes    Birth control/protection: None  Other Topics Concern  . None  Social History Narrative  . None   Outpatient Encounter Medications as of 04/28/2017  Medication Sig  . ibuprofen (ADVIL,MOTRIN) 200 MG tablet Take 400 mg by mouth every 6 (six) hours as needed.  Marland Kitchen levothyroxine (SYNTHROID, LEVOTHROID) 75 MCG tablet TAKE (1) TABLET BY MOUTH EACH MORNING BEFORE BREAKFAST.  . [DISCONTINUED] levothyroxine (SYNTHROID, LEVOTHROID) 25 MCG tablet Take 3.5 tablets (88 mcg total) by mouth daily before breakfast.  . [DISCONTINUED] levothyroxine (SYNTHROID, LEVOTHROID) 25 MCG tablet Take 1 tablet (25 mcg total) by mouth daily before breakfast.  . [DISCONTINUED] levothyroxine (SYNTHROID, LEVOTHROID) 25 MCG tablet TAKE (1) TABLET BY MOUTH EACH MORNING BEFORE BREAKFAST.   No facility-administered encounter medications on file as of 04/28/2017.    ALLERGIES: No Known Allergies VACCINATION STATUS:  There is no immunization history on file for this patient.  HPI Lisa Copeland returns with repeat thyroid function tests. 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 She is status post I-131  therapy for hyperthyroidism from Graves' disease. - Therapy was administered on 01/06/2017. - She reports improvement in her symptoms which have largely subsided , gained 7 pounds of weight overall.  The patient has family history of thyroid dysfunction  in her father reportedly was thyroid cancer and one of her grandparents who has hypothyroidism,  but the patient denies personal history of goiter.     Constitutional: + Weight gain, + fatigue, -subjective hyperthermia, no subjective hypothermia Eyes: no blurry vision, no xerophthalmia ENT: no sore throat, no nodules palpated in throat, no dysphagia/odynophagia, no hoarseness Cardiovascular: No chest pain, no shortness of breath, no palpitations.  Respiratory: no cough, no SOB Gastrointestinal: no Nausea/Vomiting/Diarhhea Musculoskeletal: no muscle/joint aches Skin: no rashes Neurological: - tremors, no numbness, no tingling, no dizziness Psychiatric: no depression, no anxiety   Objective:    BP 113/74   Pulse 76   Ht 5\' 7"  (1.702 m)   Wt 116 lb (52.6 kg)   BMI 18.17 kg/m   Wt Readings from Last 3 Encounters:  04/28/17 116 lb (52.6 kg)  03/03/17 114 lb 6.4 oz (51.9 kg)  12/29/16 109 lb (49.4 kg)     Constitutional: + Light build for height , not in acute distress, normal state of mind. Eyes: PERRLA, EOMI, no exophthalmos ENT: moist mucous membranes, + decreasing thyromegaly, no cervical lymphadenopathy Cardiovascular: normal precordial activity, + tachycardic at 125 ( on small dose of beta blocker), no Murmur/Rubs/Gallops Respiratory:  adequate breathing efforts, no gross chest deformity, Clear to auscultation bilaterally Gastrointestinal: abdomen soft, Non -  tender, No distension, Bowel Sounds present Musculoskeletal: no gross deformities, strength intact in all four extremities Skin: moist, warm, no rashes Neurological: - tremor with outstretched hands,  dependent reflexes are normal in bilateral lower 70s.    Results for  orders placed or performed in visit on 04/21/17  T4, free  Result Value Ref Range   Free T4 0.57 (L) 0.82 - 1.77 ng/dL  TSH  Result Value Ref Range   TSH 45.430 (H) 0.450 - 4.500 uIU/mL     Assessment & Plan:   1. Hyperthyroidism status post RAI therapy 2. Graves' disease - Her thyroid function tests are significant for underage replacement. I discussed and increase her levothyroxine to 75 ug by mouth every morning.   - We discussed about correct intake of levothyroxine, at fasting, with water, separated by at least 30 minutes from breakfast, and separated by more than 4 hours from calcium, iron, multivitamins, acid reflux medications (PPIs). -Patient is made aware of the fact that thyroid hormone replacement is needed for life, dose to be adjusted by periodic monitoring of thyroid function tests.  Follow up plan: Return in about 3 months (around 07/27/2017) for follow up with pre-visit labs.  Marquis LunchGebre Crystal Ellwood, MD Phone: [phone]  Fax: [phone]  This note was partially dictated with voice recognition software. Similar sounding words can be transcribed inadequately or may not  be corrected upon review.  04/28/2017, 10:19 AM

## 2017-06-13 ENCOUNTER — Other Ambulatory Visit: Payer: Self-pay | Admitting: "Endocrinology

## 2017-06-13 ENCOUNTER — Encounter: Payer: BLUE CROSS/BLUE SHIELD | Admitting: "Endocrinology

## 2017-06-13 ENCOUNTER — Encounter: Payer: Self-pay | Admitting: "Endocrinology

## 2017-06-13 VITALS — BP 117/81 | HR 84 | Wt 111.0 lb

## 2017-06-13 MED ORDER — SYNTHROID 88 MCG PO TABS: 88.0000 ug | ORAL_TABLET | Freq: Every day | ORAL | 3 refills | Status: DC

## 2017-06-14 LAB — T3, FREE: T3, Free: 2.6 pg/mL (ref 2.0–4.4)

## 2017-06-14 LAB — TSH: TSH: 5.95 u[IU]/mL — ABNORMAL HIGH (ref 0.450–4.500)

## 2017-06-14 LAB — T4, FREE: FREE T4: 1.5 ng/dL (ref 0.82–1.77)

## 2017-06-15 NOTE — Progress Notes (Signed)
This encounter was created in error - please disregard.

## 2017-06-20 ENCOUNTER — Ambulatory Visit (INDEPENDENT_AMBULATORY_CARE_PROVIDER_SITE_OTHER): Payer: BLUE CROSS/BLUE SHIELD | Admitting: "Endocrinology

## 2017-06-20 ENCOUNTER — Encounter: Payer: Self-pay | Admitting: "Endocrinology

## 2017-06-20 VITALS — BP 117/80 | HR 85 | Ht 67.0 in | Wt 106.0 lb

## 2017-06-20 DIAGNOSIS — E890 Postprocedural hypothyroidism: Secondary | ICD-10-CM | POA: Diagnosis not present

## 2017-06-20 NOTE — Progress Notes (Signed)
Subjective:    Patient ID: Lisa Copeland, female    DOB: [birth year].  History reviewed. No pertinent past medical history. Past Surgical History:  Procedure Laterality Date  . TUBAL LIGATION     Social History   Socioeconomic History  . Marital status: Married    Spouse name: None  . Number of children: None  . Years of education: None  . Highest education level: None  Social Needs  . Financial resource strain: None  . Food insecurity - worry: None  . Food insecurity - inability: None  . Transportation needs - medical: None  . Transportation needs - non-medical: None  Occupational History  . None  Tobacco Use  . Smoking status: Never Smoker  . Smokeless tobacco: Never Used  Substance and Sexual Activity  . Alcohol use: No  . Drug use: No  . Sexual activity: Yes    Birth control/protection: None  Other Topics Concern  . None  Social History Narrative  . None   Outpatient Encounter Medications as of 06/20/2017  Medication Sig  . ibuprofen (ADVIL,MOTRIN) 200 MG tablet Take 400 mg by mouth every 6 (six) hours as needed.  Marland Kitchen SYNTHROID 88 MCG tablet Take 1 tablet (88 mcg total) by mouth daily before breakfast.   No facility-administered encounter medications on file as of 06/20/2017.    ALLERGIES: No Known Allergies VACCINATION STATUS:  There is no immunization history on file for this patient.  HPI [name] returns with repeat thyroid function tests. She is status post I-131 therapy for hyperthyroidism from Graves' disease. 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  -Her original symptoms of palpitation, heat intolerance, sweating, anxiety have largely subsided.   -She has a new problem of poor appetite associated with loss of weight unintentional.  She explains work-related stress or too busy to eat  2 jobs-5 days a week.     The patient has family history of thyroid dysfunction   in her father reportedly was thyroid cancer and one of her grandparents who has hypothyroidism,  but the patient denies personal history of goiter.     Constitutional: + Weight loss , + fatigue, -subjective hyperthermia, no subjective hypothermia Eyes: no blurry vision, no xerophthalmia ENT: no sore throat, no nodules palpated in throat, + dry mouth and dry throat.  Cardiovascular: No chest pain, no shortness of breath, no palpitations.  Respiratory: no cough, no SOB Gastrointestinal: no Nausea/Vomiting/Diarhhea Musculoskeletal: no muscle/joint aches Skin: no rashes Neurological: - tremors, no numbness, no tingling, no dizziness Psychiatric: no depression, no anxiety   Objective:    BP 117/80   Pulse 85   Ht 5\' 7"  (1.702 m)   Wt 106 lb (48.1 kg)   BMI 16.60 kg/m   Wt Readings from Last 3 Encounters:  06/20/17 106 lb (48.1 kg)  06/13/17 111 lb (50.3 kg)  04/28/17 116 lb (52.6 kg)     Constitutional: + Light build for height,  not in acute distress, normal state of mind. Eyes: PERRLA, EOMI, no exophthalmos ENT: moist mucous membranes, + decreasing thyromegaly, no cervical lymphadenopathy Cardiovascular: normal precordial activity, tachycardia subsided, pulse rate at 85.    Respiratory:  adequate breathing efforts, no gross chest deformity, Clear to auscultation bilaterally  Musculoskeletal: no gross deformities, strength intact in all four extremities Skin: moist, warm, no rashes Neurological: - tremor with outstretched hands   Results for orders placed or performed in visit on  06/13/17  T4, free  Result Value Ref Range   Free T4 1.50 0.82 - 1.77 ng/dL  TSH  Result Value Ref Range   TSH 5.950 (H) 0.450 - 4.500 uIU/mL  T3, free  Result Value Ref Range   T3, Free 2.6 2.0 - 4.4 pg/mL     Assessment & Plan:   1. Hyperthyroidism status post RAI therapy 2. Graves' disease- resolved -Her current thyroid function tests are consistent with appropriate replacement.  Her  free T4 and TSH are moving in the right direction. -I advised her to remain on Synthroid 88 mcg p.o. every morning for now.  - We discussed about correct intake of levothyroxine, at fasting, with water, separated by at least 30 minutes from breakfast, and separated by more than 4 hours from calcium, iron, multivitamins, acid reflux medications (PPIs). -Patient is made aware of the fact that thyroid hormone replacement is needed for life, dose to be adjusted by periodic monitoring of thyroid function tests. -She seems to have social stress related to work and life getting in the way of her eating on time.  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   -I will add a.m. cortisol to assess adrenal function along with her next blood work.  Follow up plan: Return in about 3 months (around 09/20/2017) for follow up with pre-visit labs.  Marquis LunchGebre Kailani Brass, MD Phone: [phone]  Fax: [phone]  This note was partially dictated with voice recognition software. Similar sounding words can be transcribed inadequately or may not  be corrected upon review.  06/20/2017, 4:43 PM

## 2017-07-05 ENCOUNTER — Telehealth: Payer: Self-pay | Admitting: "Endocrinology

## 2017-07-05 NOTE — Telephone Encounter (Signed)
Patient is asking for something for anxiousness, nervousness, please advise?

## 2017-07-05 NOTE — Telephone Encounter (Signed)
I want her to go to lab tomorrow before 8AM , before making changes on her meds. Labs are ordered.

## 2017-07-06 NOTE — Telephone Encounter (Signed)
Pt.notified

## 2017-07-07 ENCOUNTER — Other Ambulatory Visit: Payer: Self-pay | Admitting: "Endocrinology

## 2017-07-08 LAB — T4, FREE: FREE T4: 1.72 ng/dL (ref 0.82–1.77)

## 2017-07-08 LAB — TSH: TSH: 2.58 u[IU]/mL (ref 0.450–4.500)

## 2017-07-12 ENCOUNTER — Other Ambulatory Visit: Payer: Self-pay | Admitting: "Endocrinology

## 2017-07-13 LAB — SPECIMEN STATUS REPORT

## 2017-07-13 LAB — CORTISOL-AM, BLOOD: Cortisol - AM: 10.5 ug/dL (ref 6.2–19.4)

## 2017-07-28 ENCOUNTER — Encounter: Payer: Self-pay | Admitting: "Endocrinology

## 2017-07-28 ENCOUNTER — Ambulatory Visit (INDEPENDENT_AMBULATORY_CARE_PROVIDER_SITE_OTHER): Payer: BLUE CROSS/BLUE SHIELD | Admitting: "Endocrinology

## 2017-07-28 VITALS — BP 121/80 | HR 76 | Ht 67.0 in | Wt 109.0 lb

## 2017-07-28 DIAGNOSIS — E890 Postprocedural hypothyroidism: Secondary | ICD-10-CM | POA: Diagnosis not present

## 2017-07-28 NOTE — Progress Notes (Signed)
Subjective:    Patient ID: Lisa Copeland, female    DOB: 10/22/1985.  History reviewed. No pertinent past medical history. Past Surgical History:  Procedure Laterality Date  . TUBAL LIGATION     Social History   Socioeconomic History  . Marital status: Married    Spouse name: Not on file  . Number of children: Not on file  . Years of education: Not on file  . Highest education level: Not on file  Occupational History  . Not on file  Social Needs  . Financial resource strain: Not on file  . Food insecurity:    Worry: Not on file    Inability: Not on file  . Transportation needs:    Medical: Not on file    Non-medical: Not on file  Tobacco Use  . Smoking status: Never Smoker  . Smokeless tobacco: Never Used  Substance and Sexual Activity  . Alcohol use: No  . Drug use: No  . Sexual activity: Yes    Birth control/protection: None  Lifestyle  . Physical activity:    Days per week: Not on file    Minutes per session: Not on file  . Stress: Not on file  Relationships  . Social connections:    Talks on phone: Not on file    Gets together: Not on file    Attends religious service: Not on file    Active member of club or organization: Not on file    Attends meetings of clubs or organizations: Not on file    Relationship status: Not on file  Other Topics Concern  . Not on file  Social History Narrative  . Not on file   Outpatient Encounter Medications as of 07/28/2017  Medication Sig  . ibuprofen (ADVIL,MOTRIN) 200 MG tablet Take 400 mg by mouth every 6 (six) hours as needed.  Marland Kitchen. SYNTHROID 88 MCG tablet Take 1 tablet (88 mcg total) by mouth daily before breakfast.   No facility-administered encounter medications on file as of 07/28/2017.    ALLERGIES: No Known Allergies VACCINATION STATUS:  There is no immunization history on file for this patient.  HPI [name] returns with repeat thyroid function tests. She is status post I-131 therapy for  hyperthyroidism from Graves' disease. - Therapy was administered on 01/06/2017. 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  -Her original symptoms of palpitation, heat intolerance, sweating, anxiety have largely subsided.   -She started to feel better with improving appetite progressively.  She has gained 3 pounds since last visit.   She explains work-related stress or too busy to eat ,  2 jobs-5 days a week.    The patient has family history of thyroid dysfunction  in her father reportedly was thyroid cancer and one of her grandparents who has hypothyroidism,  but the patient denies personal history of goiter.     Constitutional: + Steady weight, + fatigue, -subjective hyperthermia, no subjective hypothermia Eyes: no blurry vision, no xerophthalmia ENT: no sore throat, no nodules palpated in throat, + dry mouth and dry throat.  Cardiovascular: No chest pain, no shortness of breath, no palpitations.  Respiratory: no cough, no SOB Gastrointestinal: no Nausea/Vomiting/Diarhhea Musculoskeletal: no muscle/joint aches Skin: no rashes Neurological: - tremors, no numbness, no tingling, no dizziness Psychiatric: no depression, no anxiety   Objective:    BP 121/80   Pulse 76   Ht 5\' 7"  (1.702 m)   Wt 109 lb (49.4 kg)  BMI 17.07 kg/m   Wt Readings from Last 3 Encounters:  07/28/17 109 lb (49.4 kg)  06/20/17 106 lb (48.1 kg)  06/13/17 111 lb (50.3 kg)     Constitutional: + Light build for height,  not in acute distress, normal state of mind. Eyes: PERRLA, EOMI, no exophthalmos ENT: moist mucous membranes, + decreasing thyromegaly, no cervical lymphadenopathy Cardiovascular: normal precordial activity, tachycardia subsided, pulse rate at 85.    Respiratory:  adequate breathing efforts, no gross chest deformity, Clear to auscultation bilaterally  Musculoskeletal: no gross deformities, strength intact in all four extremities Skin: moist, warm, no  rashes Neurological: - tremor with outstretched hands   Results for orders placed or performed in visit on 07/07/17  T4, free  Result Value Ref Range   Free T4 1.72 0.82 - 1.77 ng/dL  TSH  Result Value Ref Range   TSH 2.580 0.450 - 4.500 uIU/mL  Cortisol-am, blood  Result Value Ref Range   Cortisol - AM 10.5 6.2 - 19.4 ug/dL  Specimen status report  Result Value Ref Range   specimen status report Comment      Assessment & Plan:   1. Hyperthyroidism status post RAI therapy 2. Graves' disease- resolved -Her current thyroid function tests are consistent with appropriate replacement.  Her free T4 and TSH are moving in the right direction. -I advised her to remain on Synthroid 88 mcg p.o. every morning for now.  - We discussed about correct intake of levothyroxine, at fasting, with water, separated by at least 30 minutes from breakfast, and separated by more than 4 hours from calcium, iron, multivitamins, acid reflux medications (PPIs). -Patient is made aware of the fact that thyroid hormone replacement is needed for life, dose to be adjusted by periodic monitoring of thyroid function tests. -She seems to have social stress related to work and life getting in the way of her eating on time.  He has significant nutritional deficit with BMI of 17.  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   -Her a.m. cortisol is normal ruling out adrenal insufficiency.  Follow up plan: Return in about 3 months (around 10/27/2017) for follow up with pre-visit labs.  Marquis Lunch, MD Phone: [phone]  Fax: [phone]  This note was partially dictated with voice recognition software. Similar sounding words can be transcribed inadequately or may not  be corrected upon review.  07/28/2017, 10:12 AM

## 2017-09-25 ENCOUNTER — Ambulatory Visit: Payer: BLUE CROSS/BLUE SHIELD | Admitting: "Endocrinology

## 2017-10-13 ENCOUNTER — Other Ambulatory Visit: Payer: Self-pay | Admitting: "Endocrinology

## 2017-10-23 ENCOUNTER — Other Ambulatory Visit: Payer: Self-pay | Admitting: "Endocrinology

## 2017-10-24 LAB — TSH: TSH: 1.7 u[IU]/mL (ref 0.450–4.500)

## 2017-10-24 LAB — T4, FREE: Free T4: 1.5 ng/dL (ref 0.82–1.77)

## 2017-10-30 ENCOUNTER — Encounter: Payer: Self-pay | Admitting: "Endocrinology

## 2017-10-30 ENCOUNTER — Ambulatory Visit (INDEPENDENT_AMBULATORY_CARE_PROVIDER_SITE_OTHER): Payer: BLUE CROSS/BLUE SHIELD | Admitting: "Endocrinology

## 2017-10-30 VITALS — BP 103/71 | HR 82 | Ht 67.0 in | Wt 112.6 lb

## 2017-10-30 DIAGNOSIS — E890 Postprocedural hypothyroidism: Secondary | ICD-10-CM

## 2017-10-30 MED ORDER — SYNTHROID 88 MCG PO TABS: ORAL_TABLET | ORAL | 6 refills | Status: DC

## 2017-10-30 NOTE — Progress Notes (Signed)
Endocrinology follow-up note  Subjective:    Patient ID: Lisa Copeland, female    DOB: 06/25/1985.  History reviewed. No pertinent past medical history. Past Surgical History:  Procedure Laterality Date  . TUBAL LIGATION     Social History   Socioeconomic History  . Marital status: Married    Spouse name: Not on file  . Number of children: Not on file  . Years of education: Not on file  . Highest education level: Not on file  Occupational History  . Not on file  Social Needs  . Financial resource strain: Not on file  . Food insecurity:    Worry: Not on file    Inability: Not on file  . Transportation needs:    Medical: Not on file    Non-medical: Not on file  Tobacco Use  . Smoking status: Never Smoker  . Smokeless tobacco: Never Used  Substance and Sexual Activity  . Alcohol use: No  . Drug use: No  . Sexual activity: Yes    Birth control/protection: None  Lifestyle  . Physical activity:    Days per week: Not on file    Minutes per session: Not on file  . Stress: Not on file  Relationships  . Social connections:    Talks on phone: Not on file    Gets together: Not on file    Attends religious service: Not on file    Active member of club or organization: Not on file    Attends meetings of clubs or organizations: Not on file    Relationship status: Not on file  Other Topics Concern  . Not on file  Social History Narrative  . Not on file   Outpatient Encounter Medications as of 10/30/2017  Medication Sig  . ibuprofen (ADVIL,MOTRIN) 200 MG tablet Take 400 mg by mouth every 6 (six) hours as needed.  Marland Kitchen. SYNTHROID 88 MCG tablet TAKE 1 TABLET BY MOUTH ONCE A DAY WITH BREAKFAST.  . [DISCONTINUED] SYNTHROID 88 MCG tablet TAKE 1 TABLET BY MOUTH ONCE A DAY WITH BREAKFAST.   No facility-administered encounter medications on file as of 10/30/2017.    ALLERGIES: No Known Allergies VACCINATION STATUS:  There is no immunization history on file for this  patient.  HPI [name] returns with repeat thyroid function tests. She is status post I-131 therapy for hyperthyroidism from Graves' disease. - Therapy was administered on 01/06/2017. She is currently on Synthroid 88 mcg p.o. 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  -She continued to feel better.  She has no new complaints today.  The patient has family history of thyroid dysfunction  in her father reportedly was thyroid cancer and one of her grandparents who has hypothyroidism,  but the patient denies personal history of goiter.     Constitutional: + Progressively gaining weight, still slight build. -subjective hyperthermia, no subjective hypothermia Eyes: no blurry vision, no xerophthalmia ENT: no sore throat, no nodules palpated in throat, + dry mouth and dry throat.  Cardiovascular: No chest pain, no shortness of breath, no palpitations.  Respiratory: no cough, no SOB Gastrointestinal: no Nausea/Vomiting/Diarhhea Musculoskeletal: no muscle/joint aches Skin: no rashes Neurological: - tremors, no numbness, no tingling, no dizziness Psychiatric: no depression, no anxiety   Objective:    BP 103/71 (BP Location: Left Arm, Patient Position: Sitting)   Pulse 82   Ht 5\' 7"  (1.702 m)   Wt 112 lb 9.6 oz (51.1 kg)   LMP 10/27/2017   SpO2 100%   BMI  17.64 kg/m   Wt Readings from Last 3 Encounters:  10/30/17 112 lb 9.6 oz (51.1 kg)  07/28/17 109 lb (49.4 kg)  06/20/17 106 lb (48.1 kg)     Constitutional: + Light build for height,  not in acute distress, normal state of mind. Eyes: PERRLA, EOMI, no exophthalmos ENT: moist mucous membranes, - thyromegaly, no cervical lymphadenopathy Cardiovascular: normal precordial activity, tachycardia subsided, pulse rate at 85.    Respiratory:  adequate breathing efforts, no gross chest deformity, Clear to auscultation bilaterally  Musculoskeletal: no gross deformities, strength intact in all four extremities Skin: moist, warm, no  rashes Neurological: - tremor with outstretched hands   Results for orders placed or performed in visit on 10/23/17  T4, free  Result Value Ref Range   Free T4 1.50 0.82 - 1.77 ng/dL  TSH  Result Value Ref Range   TSH 1.700 0.450 - 4.500 uIU/mL     Assessment & Plan:   1. Hyperthyroidism status post RAI therapy 2. Graves' disease- resolved -Her current thyroid function tests are consistent with appropriate replacement. -I advised her to continue Synthroid 88 mcg p.o. every morning.   - We discussed about correct intake of levothyroxine, at fasting, with water, separated by at least 30 minutes from breakfast, and separated by more than 4 hours from calcium, iron, multivitamins, acid reflux medications (PPIs). -Patient is made aware of the fact that thyroid hormone replacement is needed for life, dose to be adjusted by periodic monitoring of thyroid function tests.  - She has significant nutritional deficit with BMI of 17.  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   -Her a.m. cortisol is normal ruling out adrenal insufficiency.  Follow up plan: Return in about 6 months (around 05/02/2018) for follow up with pre-visit labs.  Marquis Lunch, MD Phone: [phone]  Fax: [phone]  This note was partially dictated with voice recognition software. Similar sounding words can be transcribed inadequately or may not  be corrected upon review.  10/30/2017, 4:41 PM

## 2018-04-25 ENCOUNTER — Other Ambulatory Visit: Payer: Self-pay | Admitting: "Endocrinology

## 2018-04-26 LAB — T4, FREE: Free T4: 1.64 ng/dL (ref 0.82–1.77)

## 2018-04-26 LAB — TSH: TSH: 0.256 u[IU]/mL — ABNORMAL LOW (ref 0.450–4.500)

## 2018-05-02 ENCOUNTER — Encounter: Payer: Self-pay | Admitting: "Endocrinology

## 2018-05-02 ENCOUNTER — Ambulatory Visit (INDEPENDENT_AMBULATORY_CARE_PROVIDER_SITE_OTHER): Payer: BLUE CROSS/BLUE SHIELD | Admitting: "Endocrinology

## 2018-05-02 VITALS — BP 123/77 | HR 101 | Ht 67.0 in | Wt 113.0 lb

## 2018-05-02 DIAGNOSIS — E890 Postprocedural hypothyroidism: Secondary | ICD-10-CM | POA: Diagnosis not present

## 2018-05-02 MED ORDER — SYNTHROID 88 MCG PO TABS: ORAL_TABLET | ORAL | 3 refills | Status: DC

## 2018-05-02 NOTE — Progress Notes (Signed)
Endocrinology follow-up note  Subjective:    Patient ID: Lisa Copeland, female    DOB: 1985/06/06.  History reviewed. No pertinent past medical history. Past Surgical History:  Procedure Laterality Date  . TUBAL LIGATION     Social History   Socioeconomic History  . Marital status: Married    Spouse name: Not on file  . Number of children: Not on file  . Years of education: Not on file  . Highest education level: Not on file  Occupational History  . Not on file  Social Needs  . Financial resource strain: Not on file  . Food insecurity:    Worry: Not on file    Inability: Not on file  . Transportation needs:    Medical: Not on file    Non-medical: Not on file  Tobacco Use  . Smoking status: Never Smoker  . Smokeless tobacco: Never Used  Substance and Sexual Activity  . Alcohol use: No  . Drug use: No  . Sexual activity: Yes    Birth control/protection: None  Lifestyle  . Physical activity:    Days per week: Not on file    Minutes per session: Not on file  . Stress: Not on file  Relationships  . Social connections:    Talks on phone: Not on file    Gets together: Not on file    Attends religious service: Not on file    Active member of club or organization: Not on file    Attends meetings of clubs or organizations: Not on file    Relationship status: Not on file  Other Topics Concern  . Not on file  Social History Narrative  . Not on file   Outpatient Encounter Medications as of 05/02/2018  Medication Sig  . ibuprofen (ADVIL,MOTRIN) 200 MG tablet Take 400 mg by mouth every 6 (six) hours as needed.  Marland Kitchen SYNTHROID 88 MCG tablet TAKE 1 TABLET BY MOUTH ONCE A DAY WITH BREAKFAST.  . [DISCONTINUED] SYNTHROID 88 MCG tablet TAKE 1 TABLET BY MOUTH ONCE A DAY WITH BREAKFAST.   No facility-administered encounter medications on file as of 05/02/2018.    ALLERGIES: No Known Allergies VACCINATION STATUS:  There is no immunization history on file for this  patient.  HPI [name] returns with repeat thyroid function tests. She is status post I-131 therapy for hyperthyroidism from Graves' disease. - Therapy was administered on 01/06/2017. She is currently on Synthroid 88 mcg p.o. every morning.   -She continued to feel better.  She has no new complaints today.  The patient has family history of thyroid dysfunction  in her father reportedly was thyroid cancer and one of her grandparents who has hypothyroidism,  but the patient denies personal history of goiter.     Constitutional: + Progressively gaining weight, still slight build. -subjective hyperthermia, no subjective hypothermia Eyes: no blurry vision, no xerophthalmia ENT: no sore throat, no nodules palpated in throat, + dry mouth and dry throat.  Cardiovascular: No chest pain, no shortness of breath, no palpitations.  Respiratory: no cough, no SOB Gastrointestinal: no Nausea/Vomiting/Diarhhea Musculoskeletal: no muscle/joint aches Skin: no rashes Neurological: - tremors, no numbness, no tingling, no dizziness Psychiatric: no depression, no anxiety   Objective:    BP 123/77   Pulse (!) 101   Ht 5\' 7"  (1.702 m)   Wt 113 lb (51.3 kg)   BMI 17.70 kg/m   Wt Readings from Last 3 Encounters:  05/02/18 113 lb (51.3 kg)  10/30/17 112  lb 9.6 oz (51.1 kg)  07/28/17 109 lb (49.4 kg)     Constitutional: + Light build for height,  not in acute distress, normal state of mind. Eyes: PERRLA, EOMI, no exophthalmos ENT: moist mucous membranes, - thyromegaly, no cervical lymphadenopathy Cardiovascular: normal precordial activity, tachycardia subsided, pulse rate at 85.    Respiratory:  adequate breathing efforts, no gross chest deformity, Clear to auscultation bilaterally  Musculoskeletal: no gross deformities, strength intact in all four extremities Skin: moist, warm, no rashes Neurological: - tremor with outstretched hands   Results for orders placed or performed in visit on  04/25/18  T4, free  Result Value Ref Range   Free T4 1.64 0.82 - 1.77 ng/dL  TSH  Result Value Ref Range   TSH 0.256 (L) 0.450 - 4.500 uIU/mL     Assessment & Plan:   1. Hyperthyroidism status post RAI therapy 2. Graves' disease- resolved -Her current thyroid function tests are consistent with appropriate replacement.  Will benefit from her current dose of Synthroid 88 mcg p.o. every morning.  - We discussed about the correct intake of her thyroid hormone, on empty stomach at fasting, with water, separated by at least 30 minutes from breakfast and other medications,  and separated by more than 4 hours from calcium, iron, multivitamins, acid reflux medications (PPIs). -Patient is made aware of the fact that thyroid hormone replacement is needed for life, dose to be adjusted by periodic monitoring of thyroid function tests.   Follow up plan: Return in about 3 months (around 08/01/2018) for Follow up with Pre-visit Labs.  Marquis Lunch, MD Phone: [phone]  Fax: [phone]  This note was partially dictated with voice recognition software. Similar sounding words can be transcribed inadequately or may not  be corrected upon review.  05/02/2018, 4:51 PM

## 2018-06-18 ENCOUNTER — Ambulatory Visit (INDEPENDENT_AMBULATORY_CARE_PROVIDER_SITE_OTHER): Payer: Self-pay | Admitting: Otolaryngology

## 2018-06-19 ENCOUNTER — Other Ambulatory Visit: Payer: Self-pay

## 2018-06-19 MED ORDER — SYNTHROID 88 MCG PO TABS: ORAL_TABLET | ORAL | 3 refills | Status: DC

## 2018-06-30 IMAGING — NM NM THYROID IMAGING W/ UPTAKE SINGLE (24 HR)
4 series · 4 of 4 positions shown · non-contrast
Comparison: None

CLINICAL DATA: Hyperthyroidism, palpitations, tremors, trouble
sleeping, heat sensitivity, anxious as, weight loss, TSH <

EXAM:
THYROID SCAN AND UPTAKE - 24 HOURS
TECHNIQUE: Following the per oral administration of A-RHR sodium iodide, the
patient returned at 24 hours and uptake measurements were acquired
with the uptake probe centered on the neck. Thyroid imaging was
performed following the intravenous administration of the 5c-77m
Pertechnetate.
RADIOPHARMACEUTICALS:  382 uCi X-8I9 Tiger orally

[Series 1: ant w marker · 1.18mm/px · 1 of 1 slices shown]
[im 1/1  full-range]
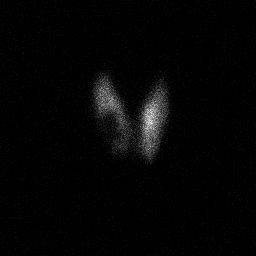

[Series 2: anterior · 1.18mm/px · 1 of 1 slices shown]
[im 1/1  full-range]
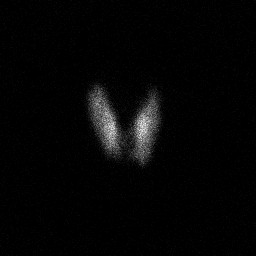

[Series 3: lao · 1.18mm/px · 1 of 1 slices shown]
[im 1/1  full-range]
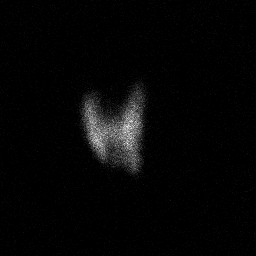

[Series 4: rao · 1.18mm/px · 1 of 1 slices shown]
[im 1/1  full-range]
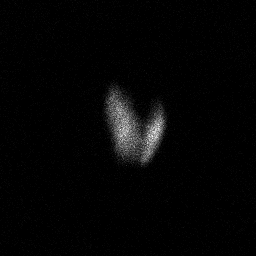

[4 of 4 positions shown; findings below may reference images not displayed]

FINDINGS: 24 hour uptake is calculated at 82%, markedly above the normal range
consistent with hyperthyroidism.

Images of the thyroid gland in 3 projections demonstrate homogeneous
increased tracer localization throughout both thyroid lobes.

No focal areas of increased or decreased tracer localization seen.
IMPRESSION: Markedly elevated 24 hour radio iodine uptake of 82%.

Homogeneous tracer uptake in both thyroid lobes.

Findings consistent with Graves disease.

## 2018-07-10 IMAGING — NM NM RAI THERAPY FOR HYPERTHYROIDISM
2 series · 2 of 2 positions shown · non-contrast
Comparison: None.

CLINICAL DATA: Hyperthyroidism

EXAM:
RADIOACTIVE IODINE THERAPY FOR HYPERTHYROIDISM
TECHNIQUE: Radioactive iodine prescribed by Dr. Sayano. The risks and benefits
of radioactive iodine therapy were discussed with the patient in
detail by Dr. Quirijn. Alternative therapies were also mentioned.
Radiation safety was discussed with the patient, including how to
protect the general public from exposure. There were no barriers to
communication. Written consent was obtained. The patient then
received a capsule containing the radiopharmaceutical.
The patient will follow-up with the referring physician.
RADIOPHARMACEUTICALS:  12.2 mCi P-TWT sodium iodide orally

[Series 1: bone statics · 2.07mm/px · 1 of 1 slices shown (1 of 2)]
[im 1/1]
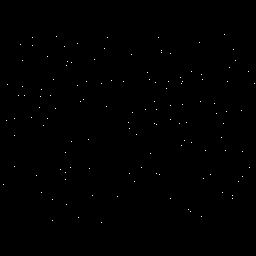

[Series 1: bone statics · 2.07mm/px · 1 of 1 slices shown (2 of 2)]
[im 1/1]
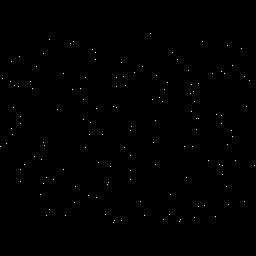

[2 of 2 positions shown; findings below may reference images not displayed]

IMPRESSION: Per oral administration of P-TWT sodium iodide for the treatment of
hyperthyroidism.

## 2018-07-18 ENCOUNTER — Other Ambulatory Visit: Payer: Self-pay | Admitting: "Endocrinology

## 2018-07-19 ENCOUNTER — Encounter: Payer: Self-pay | Admitting: "Endocrinology

## 2018-07-19 LAB — TSH: TSH: 2.28 u[IU]/mL (ref 0.450–4.500)

## 2018-07-19 LAB — T4, FREE: Free T4: 1.51 ng/dL (ref 0.82–1.77)

## 2018-07-20 ENCOUNTER — Other Ambulatory Visit: Payer: Self-pay

## 2018-07-20 ENCOUNTER — Ambulatory Visit (INDEPENDENT_AMBULATORY_CARE_PROVIDER_SITE_OTHER): Payer: BLUE CROSS/BLUE SHIELD | Admitting: "Endocrinology

## 2018-07-20 ENCOUNTER — Encounter: Payer: Self-pay | Admitting: "Endocrinology

## 2018-07-20 DIAGNOSIS — E890 Postprocedural hypothyroidism: Secondary | ICD-10-CM | POA: Diagnosis not present

## 2018-07-20 NOTE — Progress Notes (Signed)
Endocrinology Telephone Visit Follow up Note -During COVID -19 Pandemic   Subjective:    Patient ID: Lisa Copeland, female    DOB: 05/07/1985.  History reviewed. No pertinent past medical history. Past Surgical History:  Procedure Laterality Date  . TUBAL LIGATION     Social History   Socioeconomic History  . Marital status: Married    Spouse name: Not on file  . Number of children: Not on file  . Years of education: Not on file  . Highest education level: Not on file  Occupational History  . Not on file  Social Needs  . Financial resource strain: Not on file  . Food insecurity:    Worry: Not on file    Inability: Not on file  . Transportation needs:    Medical: Not on file    Non-medical: Not on file  Tobacco Use  . Smoking status: Never Smoker  . Smokeless tobacco: Never Used  Substance and Sexual Activity  . Alcohol use: No  . Drug use: No  . Sexual activity: Yes    Birth control/protection: None  Lifestyle  . Physical activity:    Days per week: Not on file    Minutes per session: Not on file  . Stress: Not on file  Relationships  . Social connections:    Talks on phone: Not on file    Gets together: Not on file    Attends religious service: Not on file    Active member of club or organization: Not on file    Attends meetings of clubs or organizations: Not on file    Relationship status: Not on file  Other Topics Concern  . Not on file  Social History Narrative  . Not on file   Outpatient Encounter Medications as of 07/20/2018  Medication Sig  . ibuprofen (ADVIL,MOTRIN) 200 MG tablet Take 400 mg by mouth every 6 (six) hours as needed.  Marland Kitchen. SYNTHROID 88 MCG tablet TAKE 1 TABLET BY MOUTH ONCE A DAY WITH BREAKFAST.   No facility-administered encounter medications on file as of 07/20/2018.    ALLERGIES: No Known Allergies VACCINATION STATUS:  There is no immunization history on file for this  patient.  HPI Lisa Copeland returns with repeat thyroid function tests. She is status post I-131 therapy for hyperthyroidism from Graves' disease. - Therapy was administered on 01/06/2017. She is currently on Synthroid 88 mcg p.o. every morning.   -She continues to feel better.  Her previsit thyroid function tests are consistent with appropriate replacement.    The patient has family history of thyroid dysfunction  in her father reportedly was thyroid cancer and one of her grandparents who has hypothyroidism,  but the patient denies personal history of goiter.     Constitutional: + Progressively gaining weight, good development for her.   still slight build. -subjective hyperthermia, no subjective hypothermia Eyes: no blurry vision, no xerophthalmia ENT: no sore throat, no nodules palpated in throat, + dry mouth and dry throat.   Objective:    There were no vitals taken for this visit.  Wt Readings from Last 3 Encounters:  05/02/18 113 lb (51.3 kg)  10/30/17 112 lb 9.6 oz (51.1 kg)  07/28/17 109 lb (49.4 kg)      Results for orders placed or performed in visit on 07/18/18  T4, free  Result Value Ref Range   Free T4 1.51 0.82 - 1.77 ng/dL  TSH  Result Value Ref Range   TSH 2.280 0.450 - 4.500 uIU/mL     Assessment & Plan:   1. Hyperthyroidism status post RAI therapy 2. Graves' disease- resolved -Her current thyroid function tests are consistent with appropriate replacement.  She is advised to continue Synthroid 88 mcg p.o. every morning.  - We discussed about the correct intake of her thyroid hormone, on empty stomach at fasting, with water, separated by at least 30 minutes from breakfast and other medications,  and separated by more than 4 hours from calcium, iron, multivitamins, acid reflux medications (PPIs). -Patient is made aware of the fact that thyroid hormone replacement is needed for life, dose to be adjusted by periodic monitoring of thyroid function tests.  Follow up  plan: Return in about 4 months (around 11/19/2018) for Follow up with Pre-visit Labs.  Lisa Lunch, MD Phone: [phone]  Fax: [phone]  This note was partially dictated with voice recognition software. Similar sounding words can be transcribed inadequately or may not  be corrected upon review.  07/20/2018, 11:17 AM

## 2018-08-02 ENCOUNTER — Encounter: Payer: BLUE CROSS/BLUE SHIELD | Admitting: "Endocrinology

## 2018-10-24 ENCOUNTER — Ambulatory Visit
Admission: EM | Admit: 2018-10-24 | Discharge: 2018-10-24 | Disposition: A | Discharge status: Home / Self Care | Payer: BC Managed Care – PPO | Attending: Emergency Medicine | Admitting: Emergency Medicine

## 2018-10-24 ENCOUNTER — Encounter: Payer: Self-pay | Admitting: Emergency Medicine

## 2018-10-24 ENCOUNTER — Other Ambulatory Visit: Payer: Self-pay

## 2018-10-24 DIAGNOSIS — M791 Myalgia, unspecified site: Secondary | ICD-10-CM | POA: Diagnosis not present

## 2018-10-24 DIAGNOSIS — Z20828 Contact with and (suspected) exposure to other viral communicable diseases: Secondary | ICD-10-CM

## 2018-10-24 DIAGNOSIS — R6889 Other general symptoms and signs: Secondary | ICD-10-CM | POA: Diagnosis not present

## 2018-10-24 DIAGNOSIS — R51 Headache: Secondary | ICD-10-CM

## 2018-10-24 DIAGNOSIS — J029 Acute pharyngitis, unspecified: Secondary | ICD-10-CM | POA: Insufficient documentation

## 2018-10-24 DIAGNOSIS — B349 Viral infection, unspecified: Secondary | ICD-10-CM | POA: Diagnosis present

## 2018-10-24 DIAGNOSIS — Z20822 Contact with and (suspected) exposure to covid-19: Secondary | ICD-10-CM

## 2018-10-24 HISTORY — DX: Disorder of thyroid, unspecified: E07.9

## 2018-10-24 LAB — POCT RAPID STREP A (OFFICE): Rapid Strep A Screen: NEGATIVE

## 2018-10-24 MED ORDER — CETIRIZINE-PSEUDOEPHEDRINE ER 5-120 MG PO TB12: 1.0000 | ORAL_TABLET | Freq: Every day | ORAL | 0 refills | Status: AC

## 2018-10-24 MED ORDER — FLUTICASONE PROPIONATE 50 MCG/ACT NA SUSP: 2.0000 | Freq: Every day | NASAL | 0 refills | Status: DC

## 2018-10-24 NOTE — Discharge Instructions (Addendum)
Strep test was negative.  Culture sent.  We will call you with abnormal results.   COVID testing ordered.    In the meantime: You should remain isolated in your home for 7 days from symptom onset AND greater than 72 hours after symptoms resolution (absence of fever without the use of fever-reducing medication and improvement in respiratory symptoms), whichever is longer Get plenty of rest and push fluids Zyrtec-D prescribed for nasal congestion, runny nose, and/or sore throat Flonase prescribed for nasal congestion and runny nose Use medications daily for symptom relief Use OTC medications like ibuprofen or tylenol as needed fever or pain Call or go to the ED if you have any new or worsening symptoms such as fever, worsening cough, shortness of breath, chest tightness, chest pain, turning blue, changes in mental status, etc..Marland Kitchen

## 2018-10-24 NOTE — ED Provider Notes (Signed)
[facility]   [id number] 10/24/18 Arrival Time: 0350   CC: HA, joint pain, and sore throat  SUBJECTIVE: History from: patient.  Lisa Copeland is a 33 y.o. female who presents with HA, joint pain x 1 week, and sore throat x 3 days.  Denies sick exposure to COVID, flu, mono, or strep.  Denies recent travel.   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  Has not tried OTC medications.  Symptoms made worse with swallowing, but tolerating own secretions and liquids without difficulty.  Reports similar joint pain and fatigue related to her thyroid disease.   Denies fever, chills, sinus pain, rhinorrhea, SOB, cough, wheezing, chest pain, chest pressure, nausea, vomiting, changes in bowel or bladder habits.    Thyroid levels well-controlled in April.    ROS: As per HPI.  All other pertinent ROS negative.     Past Medical History:  Diagnosis Date  . Thyroid disease    Past Surgical History:  Procedure Laterality Date  . TUBAL LIGATION     No Known Allergies No current facility-administered medications on file prior to encounter.    Current Outpatient Medications on File Prior to Encounter  Medication Sig Dispense Refill  . ibuprofen (ADVIL,MOTRIN) 200 MG tablet Take 400 mg by mouth every 6 (six) hours as needed.    Marland Kitchen NORTREL 1/35, 28, tablet     . SYNTHROID 88 MCG tablet TAKE 1 TABLET BY MOUTH ONCE A DAY WITH BREAKFAST. 30 tablet 3   Social History   Socioeconomic History  . Marital status: Married    Spouse name: Not on file  . Number of children: Not on file  . Years of education: Not on file  . Highest education level: Not on file  Occupational History  . Not on file  Social Needs  . Financial resource strain: Not on file  . Food insecurity    Worry: Not on file    Inability: Not on file  . Transportation needs    Medical: Not on file    Non-medical: Not on file  Tobacco Use  . Smoking status: Never Smoker  . Smokeless tobacco: Never Used  Substance and  Sexual Activity  . Alcohol use: No  . Drug use: No  . Sexual activity: Yes    Birth control/protection: None  Lifestyle  . Physical activity    Days per week: Not on file    Minutes per session: Not on file  . Stress: Not on file  Relationships  . Social Herbalist on phone: Not on file    Gets together: Not on file    Attends religious service: Not on file    Active member of club or organization: Not on file    Attends meetings of clubs or organizations: Not on file    Relationship status: Not on file  . Intimate partner violence    Fear of current or ex partner: Not on file    Emotionally abused: Not on file    Physically abused: Not on file    Forced sexual activity: Not on file  Other Topics Concern  . Not on file  Social History Narrative  . Not on file   Family History  Problem Relation Age of Onset  . Thyroid disease Mother 61       Thyroid cancer  . Hypertension Mother   . Cancer Mother 3       Thyroid cancer    OBJECTIVE:  Vitals:  10/24/18 1057  BP: 137/80  Pulse: 88  Resp: 18  Temp: 99.4 F (37.4 C)  SpO2: 99%     General appearance: alert; appears mildly fatigued, but nontoxic; speaking in full sentences and tolerating own secretions HEENT: NCAT; Ears: EACs clear, TMs pearly gray; Eyes: PERRL.  EOM grossly intact. Nose: nares patent without rhinorrhea, Throat: oropharynx clear, tonsils non erythematous or enlarged, uvula midline  Neck: supple without LAD Lungs: unlabored respirations, symmetrical air entry; cough: absent; no respiratory distress; CTAB Heart: regular rate and rhythm.  Radial pulses 2+ symmetrical bilaterally Skin: warm and dry Psychological: alert and cooperative; normal mood and affect  ASSESSMENT & PLAN:  1. Suspected Covid-19 Virus Infection   2. Sore throat   3. Viral illness     Meds ordered this encounter  Medications  . cetirizine-pseudoephedrine (ZYRTEC-D) 5-120 MG tablet    Sig: Take 1 tablet by mouth  daily.    Dispense:  30 tablet    Refill:  0    Order Specific Question:   Supervising Provider    Answer:   Eustace MooreNELSON, YVONNE SUE [[id number]]  . fluticasone (FLONASE) 50 MCG/ACT nasal spray    Sig: Place 2 sprays into both nostrils daily.    Dispense:  16 g    Refill:  0    Order Specific Question:   Supervising Provider    Answer:   Eustace MooreELSON, YVONNE SUE [[id number]]   Strep test was negative.  Culture sent.  We will call you with abnormal results.   COVID testing ordered.    In the meantime: You should remain isolated in your home for 7 days from symptom onset AND greater than 72 hours after symptoms resolution (absence of fever without the use of fever-reducing medication and improvement in respiratory symptoms), whichever is longer Get plenty of rest and push fluids Zyrtec-D prescribed for nasal congestion, runny nose, and/or sore throat Flonase prescribed for nasal congestion and runny nose Use medications daily for symptom relief Use OTC medications like ibuprofen or tylenol as needed fever or pain Call or go to the ED if you have any new or worsening symptoms such as fever, worsening cough, shortness of breath, chest tightness, chest pain, turning blue, changes in mental status, etc...   Reviewed expectations re: course of current medical issues. Questions answered. Outlined signs and symptoms indicating need for more acute intervention. Patient verbalized understanding. After Visit Summary given.         Rennis HardingWurst, Khyla Mccumbers, PA-C 10/24/18 1159

## 2018-10-24 NOTE — ED Triage Notes (Signed)
Pt c/o sore throat, headache, joint pain, scratchy throat x1 week. Pt states she also has hx of thyroid issues.

## 2018-10-25 LAB — CULTURE, GROUP A STREP (THRC)

## 2018-10-27 LAB — NOVEL CORONAVIRUS, NAA: SARS-CoV-2, NAA: NOT DETECTED

## 2018-10-29 ENCOUNTER — Encounter (HOSPITAL_COMMUNITY): Payer: Self-pay

## 2018-11-09 NOTE — Progress Notes (Signed)
This encounter was created in error - please disregard.

## 2018-11-13 ENCOUNTER — Other Ambulatory Visit: Payer: Self-pay | Admitting: "Endocrinology

## 2018-11-14 LAB — VITAMIN D 25 HYDROXY (VIT D DEFICIENCY, FRACTURES): Vit D, 25-Hydroxy: 33.6 ng/mL (ref 30.0–100.0)

## 2018-11-14 LAB — TSH: TSH: 3.39 u[IU]/mL (ref 0.450–4.500)

## 2018-11-14 LAB — T4, FREE: Free T4: 1.5 ng/dL (ref 0.82–1.77)

## 2018-11-16 ENCOUNTER — Ambulatory Visit (INDEPENDENT_AMBULATORY_CARE_PROVIDER_SITE_OTHER): Payer: BC Managed Care – PPO | Admitting: "Endocrinology

## 2018-11-16 ENCOUNTER — Encounter: Payer: Self-pay | Admitting: "Endocrinology

## 2018-11-16 ENCOUNTER — Other Ambulatory Visit: Payer: Self-pay

## 2018-11-16 DIAGNOSIS — E890 Postprocedural hypothyroidism: Secondary | ICD-10-CM | POA: Diagnosis not present

## 2018-11-16 MED ORDER — SYNTHROID 88 MCG PO TABS: ORAL_TABLET | ORAL | 1 refills | Status: AC

## 2018-11-16 NOTE — Progress Notes (Addendum)
11/16/2018                                Endocrinology Telehealth Visit Follow up Note -During COVID -19 Pandemic  I connected with Lisa Copeland on 11/16/2018   by telephone and verified that I am speaking with the correct person using two identifiers. Lisa Copeland, Feb 17, 1986. she has verbally consented to this visit. All issues noted in this document were discussed and addressed. The format was not optimal for physical exam.    Subjective:    Patient ID: Lisa Copeland, female    DOB: November 09, 1985.  Past Medical History:  Diagnosis Date  . Thyroid disease    Past Surgical History:  Procedure Laterality Date  . TUBAL LIGATION     Social History   Socioeconomic History  . Marital status: Married    Spouse name: Not on file  . Number of children: Not on file  . Years of education: Not on file  . Highest education level: Not on file  Occupational History  . Not on file  Social Needs  . Financial resource strain: Not on file  . Food insecurity    Worry: Not on file    Inability: Not on file  . Transportation needs    Medical: Not on file    Non-medical: Not on file  Tobacco Use  . Smoking status: Never Smoker  . Smokeless tobacco: Never Used  Substance and Sexual Activity  . Alcohol use: No  . Drug use: No  . Sexual activity: Yes    Birth control/protection: None  Lifestyle  . Physical activity    Days per week: Not on file    Minutes per session: Not on file  . Stress: Not on file  Relationships  . Social Herbalist on phone: Not on file    Gets together: Not on file    Attends religious service: Not on file    Active member of club or organization: Not on file    Attends meetings of clubs or organizations: Not on file    Relationship status: Not on file  Other Topics Concern  . Not on file  Social History Narrative  . Not on file   Outpatient Encounter Medications as of 11/16/2018  Medication Sig  .  cetirizine-pseudoephedrine (ZYRTEC-D) 5-120 MG tablet Take 1 tablet by mouth daily.  . fluticasone (FLONASE) 50 MCG/ACT nasal spray Place 2 sprays into both nostrils daily.  Marland Kitchen ibuprofen (ADVIL,MOTRIN) 200 MG tablet Take 400 mg by mouth every 6 (six) hours as needed.  Marland Kitchen NORTREL 1/35, 28, tablet   . SYNTHROID 88 MCG tablet TAKE 1 TABLET BY MOUTH ONCE A DAY WITH BREAKFAST.  . [DISCONTINUED] SYNTHROID 88 MCG tablet TAKE 1 TABLET BY MOUTH ONCE A DAY WITH BREAKFAST.   No facility-administered encounter medications on file as of 11/16/2018.    ALLERGIES: No Known Allergies VACCINATION STATUS:  There is no immunization history on file for this patient.  HPI [name] is engaged in telehealth via telephone for follow-up of hypothyroidism.   She is status post I-131 therapy for hyperthyroidism from Graves' disease. - Therapy was administered on 01/06/2017. She is currently on Synthroid 88 mcg p.o. every morning.   -She continues to feel better.  Her previsit thyroid function tests are consistent with appropriate  replacement.    The patient has family history of thyroid dysfunction  in her father reportedly was thyroid cancer and one of her grandparents who has hypothyroidism,  but the patient denies personal history of goiter.     Review of systems: Limited as above.   Objective:    There were no vitals taken for this visit.  Wt Readings from Last 3 Encounters:  05/02/18 113 lb (51.3 kg)  10/30/17 112 lb 9.6 oz (51.1 kg)  07/28/17 109 lb (49.4 kg)      Results for orders placed or performed in visit on 11/13/18  T4, free  Result Value Ref Range   Free T4 1.50 0.82 - 1.77 ng/dL  TSH  Result Value Ref Range   TSH 3.390 0.450 - 4.500 uIU/mL  VITAMIN D 25 Hydroxy (Vit-D Deficiency, Fractures)  Result Value Ref Range   Vit D, 25-Hydroxy 33.6 30.0 - 100.0 ng/mL     Assessment & Plan:   1. Hyperthyroidism status post RAI therapy 2. Graves' disease- resolved -Her current thyroid  function tests are consistent with appropriate replacement.  She is advised to continue Synthroid at 88 mcg daily before breakfast.   - We discussed about the correct intake of her thyroid hormone, on empty stomach at fasting, with water, separated by at least 30 minutes from breakfast and other medications,  and separated by more than 4 hours from calcium, iron, multivitamins, acid reflux medications (PPIs). -Patient is made aware of the fact that thyroid hormone replacement is needed for life, dose to be adjusted by periodic monitoring of thyroid function tests.   Time for this visit: 15 minutes. Boris SharperStacey R [place]  participated in the discussions, expressed understanding, and voiced agreement with the above plans.  All questions were answered to her satisfaction. she is encouraged to contact clinic should she have any questions or concerns prior to her return visit.  Follow up plan: Return in about 6 months (around 05/19/2019) for Follow up with Pre-visit Labs.  Marquis LunchGebre Ruta Capece, MD Phone: [phone]  Fax: [phone]  This note was partially dictated with voice recognition software. Similar sounding words can be transcribed inadequately or may not  be corrected upon review.  11/16/2018, 11:02 AM

## 2018-11-19 ENCOUNTER — Ambulatory Visit: Payer: BLUE CROSS/BLUE SHIELD | Admitting: "Endocrinology

## 2019-05-22 ENCOUNTER — Ambulatory Visit: Payer: BC Managed Care – PPO | Admitting: "Endocrinology

## 2020-02-16 ENCOUNTER — Other Ambulatory Visit: Payer: Self-pay

## 2020-02-16 ENCOUNTER — Ambulatory Visit
Admission: EM | Admit: 2020-02-16 | Discharge: 2020-02-16 | Disposition: A | Discharge status: Home / Self Care | Payer: BLUE CROSS/BLUE SHIELD | Attending: Emergency Medicine | Admitting: Emergency Medicine

## 2020-02-16 DIAGNOSIS — Z1152 Encounter for screening for COVID-19: Secondary | ICD-10-CM

## 2020-02-16 NOTE — ED Triage Notes (Signed)
Pt needs test after covid exposure

## 2020-02-17 LAB — SARS-COV-2, NAA 2 DAY TAT

## 2020-02-17 LAB — NOVEL CORONAVIRUS, NAA: SARS-CoV-2, NAA: NOT DETECTED

## 2020-02-18 ENCOUNTER — Other Ambulatory Visit: Payer: Self-pay

## 2020-02-18 ENCOUNTER — Ambulatory Visit
Admission: EM | Admit: 2020-02-18 | Discharge: 2020-02-18 | Disposition: A | Discharge status: Home / Self Care | Payer: BLUE CROSS/BLUE SHIELD | Attending: Emergency Medicine | Admitting: Emergency Medicine

## 2020-02-18 DIAGNOSIS — J069 Acute upper respiratory infection, unspecified: Secondary | ICD-10-CM | POA: Diagnosis not present

## 2020-02-18 DIAGNOSIS — R519 Headache, unspecified: Secondary | ICD-10-CM | POA: Diagnosis not present

## 2020-02-18 DIAGNOSIS — Z1152 Encounter for screening for COVID-19: Secondary | ICD-10-CM | POA: Diagnosis not present

## 2020-02-18 DIAGNOSIS — R059 Cough, unspecified: Secondary | ICD-10-CM | POA: Diagnosis not present

## 2020-02-18 DIAGNOSIS — J029 Acute pharyngitis, unspecified: Secondary | ICD-10-CM | POA: Diagnosis not present

## 2020-02-18 LAB — POCT RAPID STREP A (OFFICE): Rapid Strep A Screen: NEGATIVE

## 2020-02-18 MED ORDER — CETIRIZINE HCL 10 MG PO TABS: 10.0000 mg | ORAL_TABLET | Freq: Every day | ORAL | 0 refills | Status: AC

## 2020-02-18 MED ORDER — BENZONATATE 100 MG PO CAPS: 100.0000 mg | ORAL_CAPSULE | Freq: Three times a day (TID) | ORAL | 0 refills | Status: AC

## 2020-02-18 MED ORDER — FLUTICASONE PROPIONATE 50 MCG/ACT NA SUSP: 1.0000 | Freq: Every day | NASAL | 0 refills | Status: AC

## 2020-02-18 MED ORDER — DEXAMETHASONE 4 MG PO TABS: 4.0000 mg | ORAL_TABLET | Freq: Every day | ORAL | 0 refills | Status: AC

## 2020-02-18 NOTE — Discharge Instructions (Addendum)
  COVID testing ordered.  It will take between 2-7 days for test results.  Someone will contact you regarding abnormal results.    In the meantime: You should remain isolated in your home for 10 days from symptom onset AND greater than 72 hours after symptoms resolution (absence of fever without the use of fever-reducing medication and improvement in respiratory symptoms), whichever is longer Get plenty of rest and push fluids Tessalon Perles prescribed for cough Zyrtec for nasal congestion, runny nose, and/or sore throat Flonase for nasal congestion and runny nose Drops prescribed Use medications daily for symptom relief Use OTC medications like ibuprofen or tylenol as needed fever or pain Call or go to the ED if you have any new or worsening symptoms such as fever, worsening cough, shortness of breath, chest tightness, chest pain, turning blue, changes in mental status, etc..Marland Kitchen

## 2020-02-18 NOTE — ED Provider Notes (Signed)
[facility]   [id number] 02/18/20 Arrival Time: 1240   CC: COVID symptoms  SUBJECTIVE: History from: patient.  Lisa Copeland is a 34 y.o. female who presented to the urgent care for complaint of sore throat, cough and headache for the past 2 days.  Has a sibling with URI symptoms.  Denies recent travel.  Has tried OTC medication without relief.  Denies aggravating factors.  Denies previous symptoms in the past.   Denies fever, chills, fatigue, sinus pain, rhinorrhea, sore throat, SOB, wheezing, chest pain, nausea, changes in bowel or bladder habits.    ROS: As per HPI.  All other pertinent ROS negative.     Past Medical History:  Diagnosis Date  . Thyroid disease    Past Surgical History:  Procedure Laterality Date  . TUBAL LIGATION     No Known Allergies No current facility-administered medications on file prior to encounter.   Current Outpatient Medications on File Prior to Encounter  Medication Sig Dispense Refill  . ibuprofen (ADVIL,MOTRIN) 200 MG tablet Take 400 mg by mouth every 6 (six) hours as needed.    Marland Kitchen SYNTHROID 100 MCG tablet Take by mouth.    . SYNTHROID 88 MCG tablet TAKE 1 TABLET BY MOUTH ONCE A DAY WITH BREAKFAST. 90 tablet 1  . cetirizine-pseudoephedrine (ZYRTEC-D) 5-120 MG tablet Take 1 tablet by mouth daily. 30 tablet 0  . NORTREL 1/35, 28, tablet      Social History   Socioeconomic History  . Marital status: Married    Spouse name: Not on file  . Number of children: Not on file  . Years of education: Not on file  . Highest education level: Not on file  Occupational History  . Not on file  Tobacco Use  . Smoking status: Never Smoker  . Smokeless tobacco: Never Used  Vaping Use  . Vaping Use: Never used  Substance and Sexual Activity  . Alcohol use: No  . Drug use: No  . Sexual activity: Yes    Birth control/protection: None  Other Topics Concern  . Not on file  Social History Narrative  . Not on file   Social Determinants of  Health   Financial Resource Strain:   . Difficulty of Paying Living Expenses: Not on file  Food Insecurity:   . Worried About Programme researcher, broadcasting/film/video in the Last Year: Not on file  . Ran Out of Food in the Last Year: Not on file  Transportation Needs:   . Lack of Transportation (Medical): Not on file  . Lack of Transportation (Non-Medical): Not on file  Physical Activity:   . Days of Exercise per Week: Not on file  . Minutes of Exercise per Session: Not on file  Stress:   . Feeling of Stress : Not on file  Social Connections:   . Frequency of Communication with Friends and Family: Not on file  . Frequency of Social Gatherings with Friends and Family: Not on file  . Attends Religious Services: Not on file  . Active Member of Clubs or Organizations: Not on file  . Attends Banker Meetings: Not on file  . Marital Status: Not on file  Intimate Partner Violence:   . Fear of Current or Ex-Partner: Not on file  . Emotionally Abused: Not on file  . Physically Abused: Not on file  . Sexually Abused: Not on file   Family History  Problem Relation Age of Onset  . Thyroid disease Mother 73  Thyroid cancer  . Hypertension Mother   . Cancer Mother 43       Thyroid cancer    OBJECTIVE:  Vitals:   02/18/20 1352 02/18/20 1354  BP:  121/75  Pulse:  89  Resp:  17  Temp:  98.7 F (37.1 C)  TempSrc:  Oral  SpO2:  98%  Weight: 105 lb (47.6 kg)   Height: 5\' 7"  (1.702 m)      General appearance: alert; appears fatigued, but nontoxic; speaking in full sentences and tolerating own secretions HEENT: NCAT; Ears: EACs clear, TMs pearly gray; Eyes: PERRL.  EOM grossly intact. Sinuses: nontender; Nose: nares patent without rhinorrhea, Throat: oropharynx clear, tonsils non erythematous or enlarged, uvula midline  Neck: supple without LAD Lungs: unlabored respirations, symmetrical air entry; cough: mild; no respiratory distress; CTAB Heart: regular rate and rhythm.  Radial pulses  2+ symmetrical bilaterally Skin: warm and dry Psychological: alert and cooperative; normal mood and affect  LABS:  Results for orders placed or performed during the hospital encounter of 02/18/20 (from the past 24 hour(s))  POCT rapid strep A     Status: None   Collection Time: 02/18/20  2:00 PM  Result Value Ref Range   Rapid Strep A Screen Negative Negative     ASSESSMENT & PLAN:  1. Viral URI with cough   2. Sore throat   3. Encounter for screening for COVID-19   4. Acute nonintractable headache, unspecified headache type     Meds ordered this encounter  Medications  . fluticasone (FLONASE) 50 MCG/ACT nasal spray    Sig: Place 1 spray into both nostrils daily for 14 days.    Dispense:  16 g    Refill:  0  . cetirizine (ZYRTEC ALLERGY) 10 MG tablet    Sig: Take 1 tablet (10 mg total) by mouth daily.    Dispense:  30 tablet    Refill:  0  . benzonatate (TESSALON) 100 MG capsule    Sig: Take 1 capsule (100 mg total) by mouth every 8 (eight) hours.    Dispense:  30 capsule    Refill:  0  . dexamethasone (DECADRON) 4 MG tablet    Sig: Take 1 tablet (4 mg total) by mouth daily for 7 days.    Dispense:  7 tablet    Refill:  0    Discharge instructions  COVID testing ordered.  It will take between 2-7 days for test results.  Someone will contact you regarding abnormal results.    In the meantime: You should remain isolated in your home for 10 days from symptom onset AND greater than 72 hours after symptoms resolution (absence of fever without the use of fever-reducing medication and improvement in respiratory symptoms), whichever is longer Get plenty of rest and push fluids Tessalon Perles prescribed for cough Zyrtec for nasal congestion, runny nose, and/or sore throat Flonase for nasal congestion and runny nose Drops prescribed Use medications daily for symptom relief Use OTC medications like ibuprofen or tylenol as needed fever or pain Call or go to the ED if you  have any new or worsening symptoms such as fever, worsening cough, shortness of breath, chest tightness, chest pain, turning blue, changes in mental status, etc...   Reviewed expectations re: course of current medical issues. Questions answered. Outlined signs and symptoms indicating need for more acute intervention. Patient verbalized understanding. After Visit Summary given.         02/20/20, FNP 02/18/20 1447

## 2020-02-18 NOTE — ED Triage Notes (Signed)
Pt states that she has a sore throat with a blister on the back of her tongue. Pt states that she also has a cough, and headache. x2 days. Pt states that she recently had a covid test which was negative. Pt states that she is not vaccinated.

## 2020-02-20 LAB — COVID-19, FLU A+B AND RSV
Influenza A, NAA: NOT DETECTED
Influenza B, NAA: NOT DETECTED
RSV, NAA: NOT DETECTED
SARS-CoV-2, NAA: DETECTED — AB

## 2020-02-21 LAB — CULTURE, GROUP A STREP (THRC)

## 2020-07-06 ENCOUNTER — Encounter: Payer: Self-pay | Admitting: Internal Medicine

## 2020-08-04 ENCOUNTER — Ambulatory Visit: Payer: Self-pay | Admitting: Nurse Practitioner

## 2020-10-19 ENCOUNTER — Ambulatory Visit: Payer: Self-pay | Admitting: Gastroenterology

## 2022-02-21 DIAGNOSIS — Z681 Body mass index (BMI) 19 or less, adult: Secondary | ICD-10-CM | POA: Diagnosis not present

## 2022-02-21 DIAGNOSIS — E038 Other specified hypothyroidism: Secondary | ICD-10-CM | POA: Diagnosis not present

## 2022-02-21 DIAGNOSIS — U071 COVID-19: Secondary | ICD-10-CM | POA: Diagnosis not present

## 2022-04-08 DIAGNOSIS — Z0001 Encounter for general adult medical examination with abnormal findings: Secondary | ICD-10-CM | POA: Diagnosis not present

## 2022-04-08 DIAGNOSIS — Z1331 Encounter for screening for depression: Secondary | ICD-10-CM | POA: Diagnosis not present

## 2022-04-08 DIAGNOSIS — E038 Other specified hypothyroidism: Secondary | ICD-10-CM | POA: Diagnosis not present

## 2022-04-08 DIAGNOSIS — Z6820 Body mass index (BMI) 20.0-20.9, adult: Secondary | ICD-10-CM | POA: Diagnosis not present

## 2022-04-11 DIAGNOSIS — E038 Other specified hypothyroidism: Secondary | ICD-10-CM | POA: Diagnosis not present

## 2022-04-11 DIAGNOSIS — Z0001 Encounter for general adult medical examination with abnormal findings: Secondary | ICD-10-CM | POA: Diagnosis not present

## 2022-04-11 DIAGNOSIS — R5383 Other fatigue: Secondary | ICD-10-CM | POA: Diagnosis not present

## 2024-05-20 ENCOUNTER — Encounter: Payer: Self-pay | Admitting: Obstetrics & Gynecology
